# Patient Record
Sex: Female | Born: 1975 | Race: White | Hispanic: No | Smoking: Current every day smoker
Health system: Southern US, Community
[De-identification: ages and names within clinical notes are randomized; demographics above are authoritative.]

## PROBLEM LIST (undated history)

## (undated) DIAGNOSIS — R519 Headache, unspecified: Secondary | ICD-10-CM

## (undated) DIAGNOSIS — I10 Essential (primary) hypertension: Secondary | ICD-10-CM

## (undated) DIAGNOSIS — F419 Anxiety disorder, unspecified: Secondary | ICD-10-CM

## (undated) DIAGNOSIS — R87629 Unspecified abnormal cytological findings in specimens from vagina: Secondary | ICD-10-CM

## (undated) DIAGNOSIS — R51 Headache: Secondary | ICD-10-CM

## (undated) HISTORY — DX: Unspecified abnormal cytological findings in specimens from vagina: R87.629

## (undated) HISTORY — DX: Headache, unspecified: R51.9

## (undated) HISTORY — DX: Essential (primary) hypertension: I10

## (undated) HISTORY — DX: Anxiety disorder, unspecified: F41.9

## (undated) HISTORY — DX: Headache: R51

---

## 2001-04-08 ENCOUNTER — Other Ambulatory Visit: Admission: RE | Admit: 2001-04-08 | Discharge: 2001-04-08 | Payer: Self-pay | Admitting: Family Medicine

## 2001-05-27 ENCOUNTER — Other Ambulatory Visit: Admission: RE | Admit: 2001-05-27 | Discharge: 2001-05-27 | Payer: Self-pay | Admitting: Family Medicine

## 2002-05-07 ENCOUNTER — Other Ambulatory Visit: Admission: RE | Admit: 2002-05-07 | Discharge: 2002-05-07 | Payer: Self-pay | Admitting: Family Medicine

## 2003-06-25 ENCOUNTER — Other Ambulatory Visit: Admission: RE | Admit: 2003-06-25 | Discharge: 2003-06-25 | Payer: Self-pay | Admitting: Family Medicine

## 2004-08-10 ENCOUNTER — Other Ambulatory Visit: Admission: RE | Admit: 2004-08-10 | Discharge: 2004-08-10 | Payer: Self-pay | Admitting: Family Medicine

## 2005-10-12 ENCOUNTER — Other Ambulatory Visit: Admission: RE | Admit: 2005-10-12 | Discharge: 2005-10-12 | Payer: Self-pay | Admitting: Family Medicine

## 2006-10-25 ENCOUNTER — Other Ambulatory Visit: Admission: RE | Admit: 2006-10-25 | Discharge: 2006-10-25 | Payer: Self-pay | Admitting: Family Medicine

## 2008-07-01 ENCOUNTER — Emergency Department (HOSPITAL_COMMUNITY): Admission: EM | Admit: 2008-07-01 | Discharge: 2008-07-01 | Payer: Self-pay | Admitting: Emergency Medicine

## 2008-07-06 ENCOUNTER — Other Ambulatory Visit: Admission: RE | Admit: 2008-07-06 | Discharge: 2008-07-06 | Payer: Self-pay | Admitting: Obstetrics and Gynecology

## 2008-09-29 ENCOUNTER — Ambulatory Visit (HOSPITAL_COMMUNITY): Admission: RE | Admit: 2008-09-29 | Discharge: 2008-09-29 | Payer: Self-pay | Admitting: Obstetrics & Gynecology

## 2008-10-07 ENCOUNTER — Ambulatory Visit (HOSPITAL_COMMUNITY): Admission: RE | Admit: 2008-10-07 | Discharge: 2008-10-07 | Payer: Self-pay | Admitting: Obstetrics & Gynecology

## 2008-10-18 ENCOUNTER — Ambulatory Visit (HOSPITAL_COMMUNITY): Admission: RE | Admit: 2008-10-18 | Discharge: 2008-10-18 | Payer: Self-pay | Admitting: Obstetrics & Gynecology

## 2008-11-05 ENCOUNTER — Emergency Department (HOSPITAL_COMMUNITY): Admission: EM | Admit: 2008-11-05 | Discharge: 2008-11-06 | Payer: Self-pay | Admitting: Emergency Medicine

## 2008-11-11 ENCOUNTER — Ambulatory Visit (HOSPITAL_COMMUNITY): Admission: RE | Admit: 2008-11-11 | Discharge: 2008-11-11 | Payer: Self-pay | Admitting: Obstetrics & Gynecology

## 2008-12-10 ENCOUNTER — Ambulatory Visit (HOSPITAL_COMMUNITY): Admission: RE | Admit: 2008-12-10 | Discharge: 2008-12-10 | Payer: Self-pay | Admitting: Obstetrics & Gynecology

## 2009-01-20 ENCOUNTER — Inpatient Hospital Stay (HOSPITAL_COMMUNITY): Admission: AD | Admit: 2009-01-20 | Discharge: 2009-01-23 | Payer: Self-pay | Admitting: Obstetrics & Gynecology

## 2009-01-20 ENCOUNTER — Ambulatory Visit: Payer: Self-pay | Admitting: Obstetrics & Gynecology

## 2009-01-21 DIAGNOSIS — O42919 Preterm premature rupture of membranes, unspecified as to length of time between rupture and onset of labor, unspecified trimester: Secondary | ICD-10-CM

## 2009-11-18 IMAGING — US US OB FOLLOW-UP
1 series · 14 of 28 positions shown · non-contrast
Comparison: none

OBSTETRICAL ULTRASOUND:
 This ultrasound was performed in The [HOSPITAL], and the [facility] report will be stored to [REDACTED] PACS.

[Series 1: us ob follow-up · 14 of 31 slices shown]
[im 2/31]
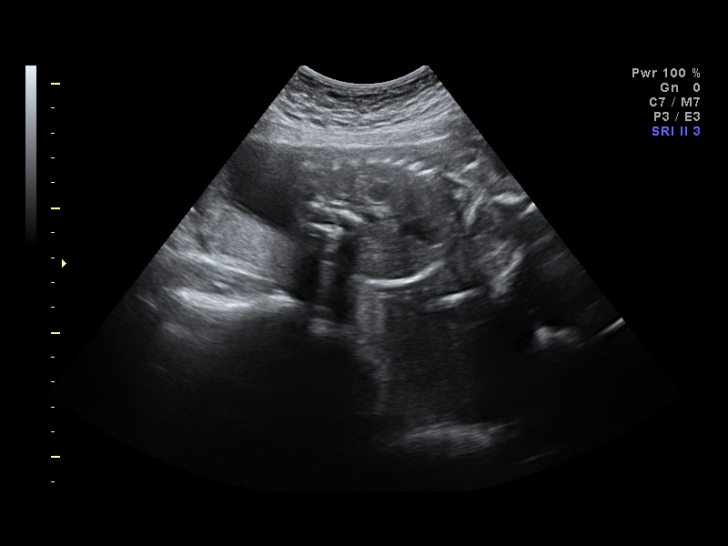
[im 4/31]
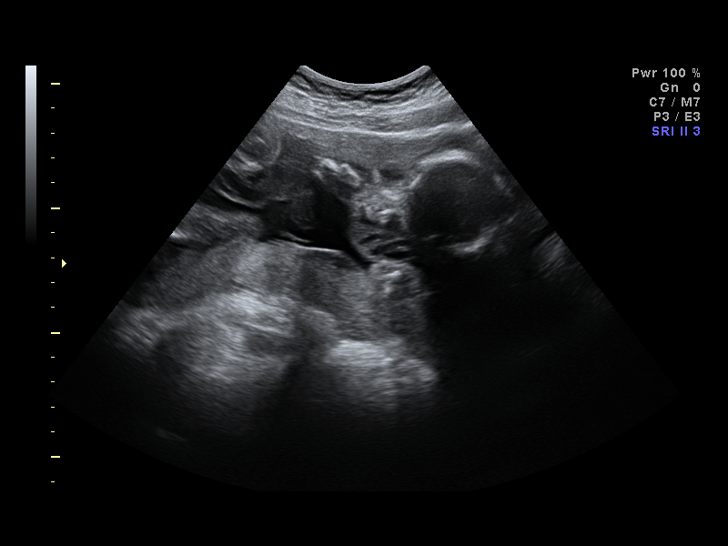
[im 6/31]
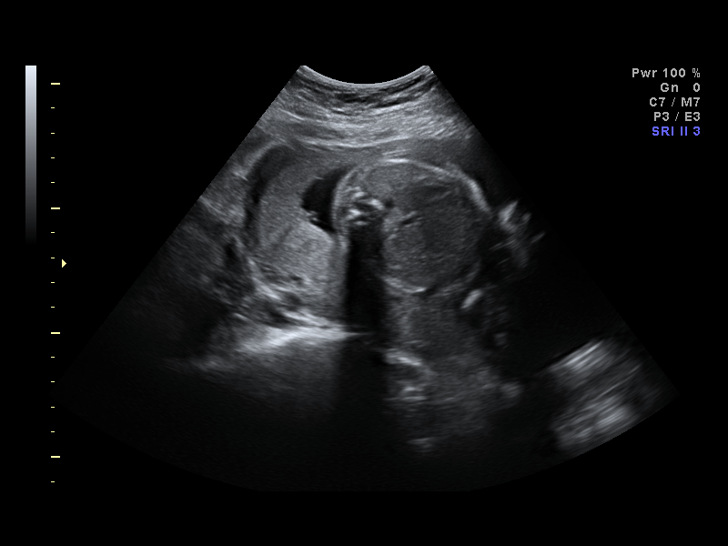
[im 8/31]
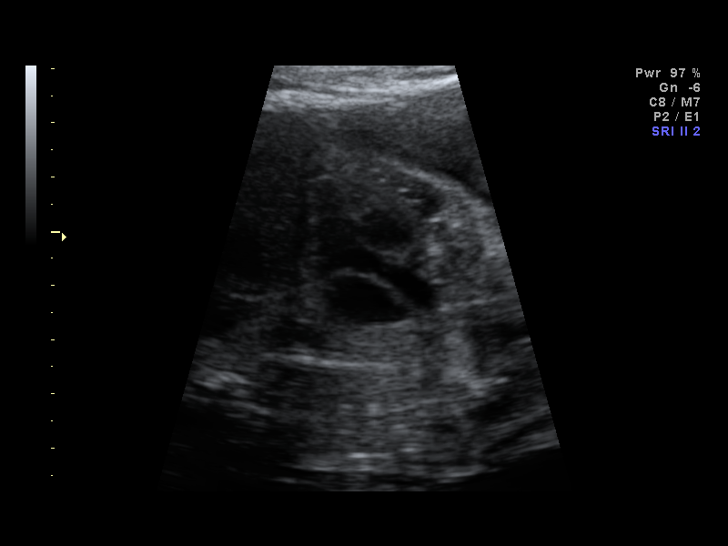
[im 11/31]
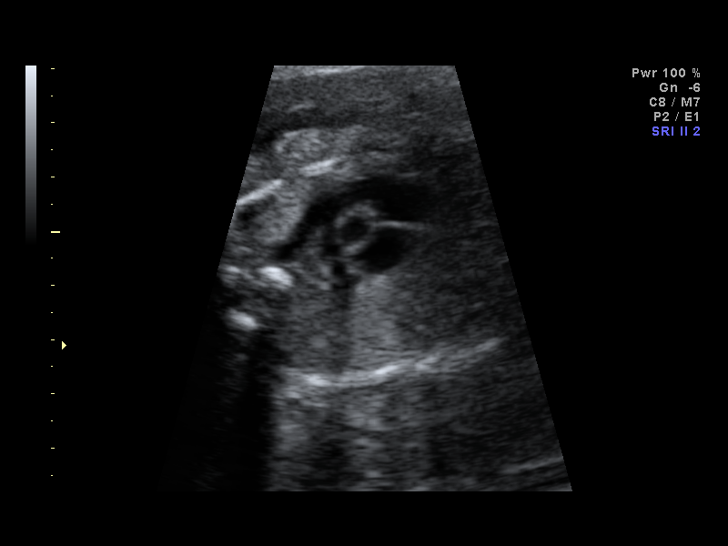
[im 13/31]
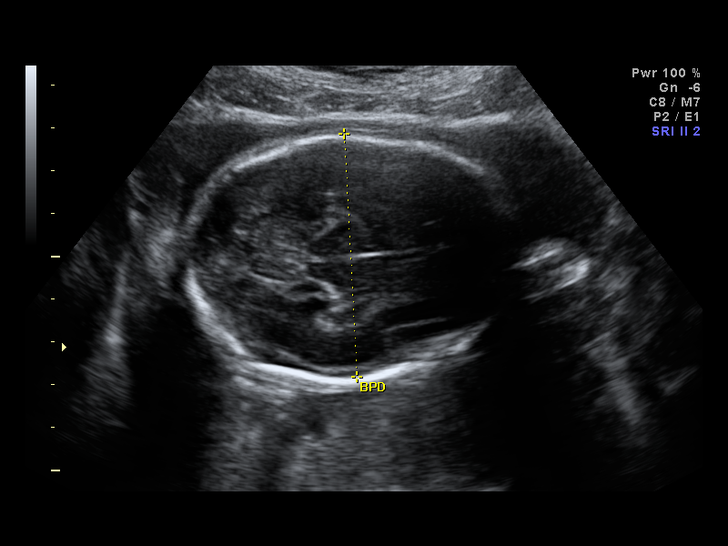
[im 15/31]
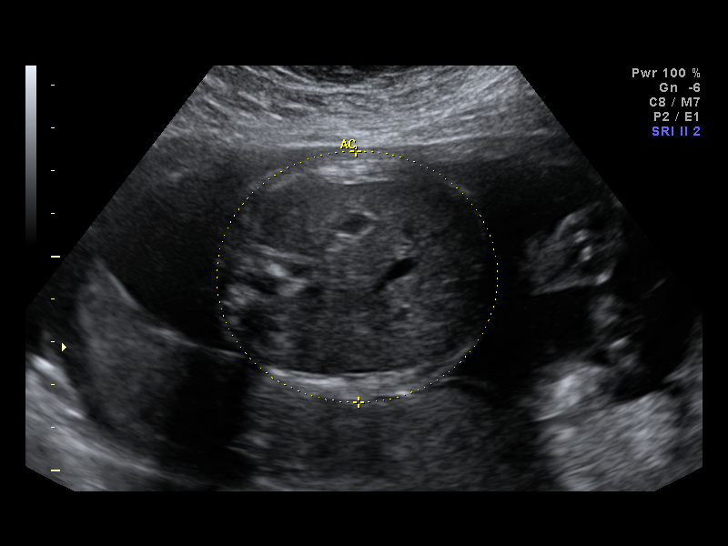
[im 17/31]
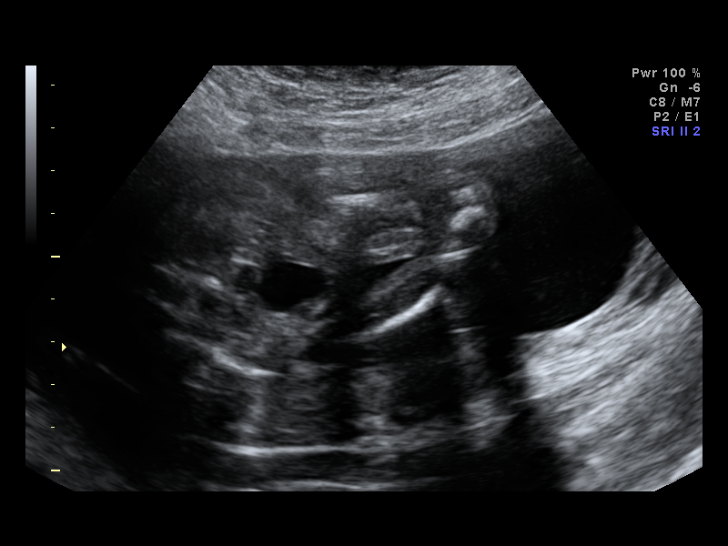
[im 19/31]
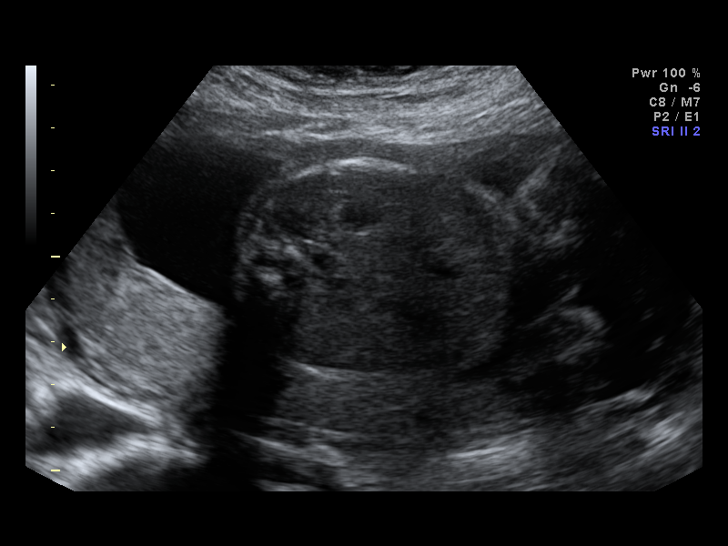
[im 22/31]
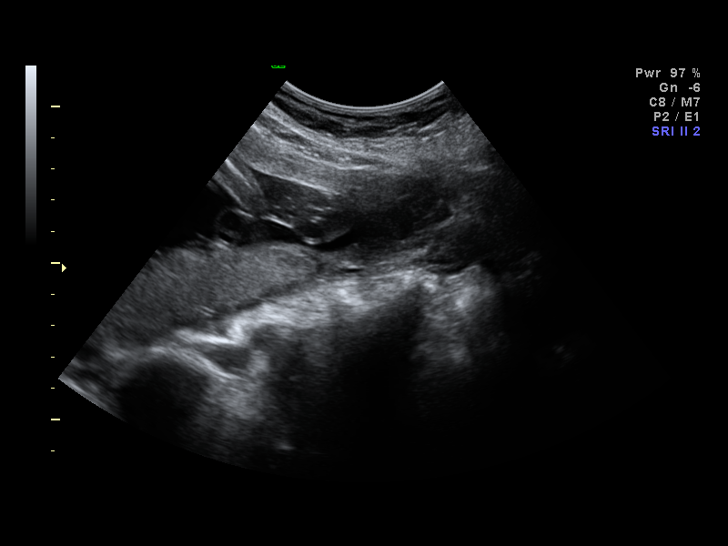
[im 24/31]
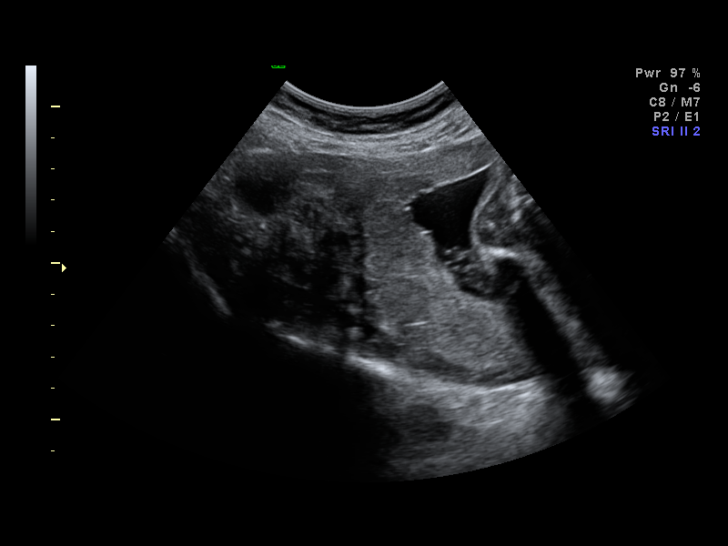
[im 26/31]
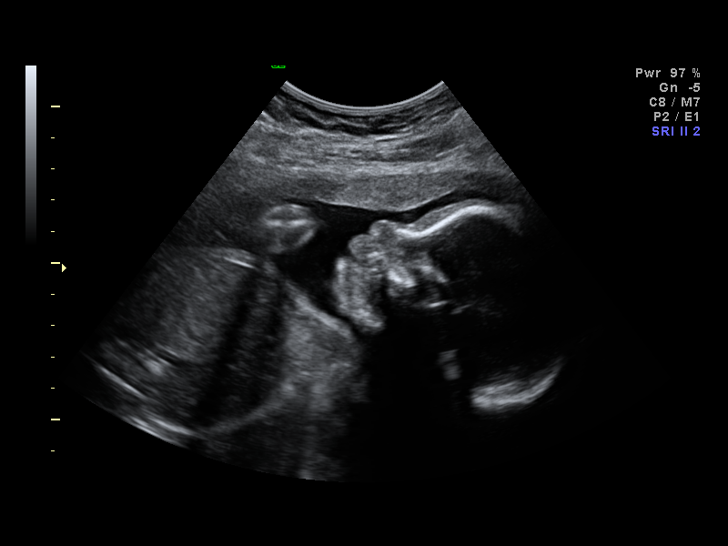
[im 28/31]
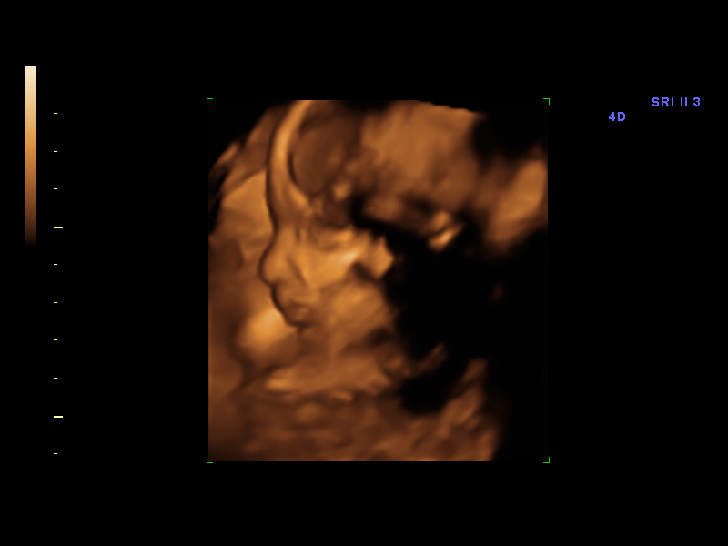
[im 31/31]
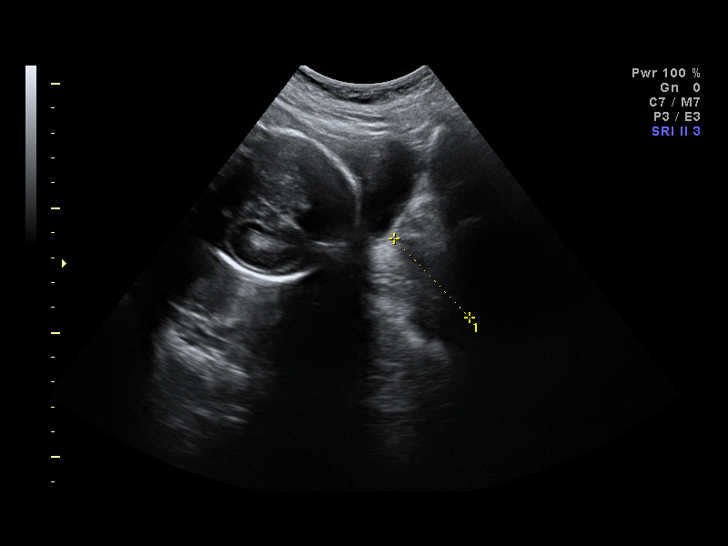

[14 of 28 positions shown; findings below may reference images not displayed]

IMPRESSION: [facility] has also been faxed to the ordering physician.

## 2009-12-17 IMAGING — US US OB FOLLOW-UP
1 series · 18 of 28 positions shown · non-contrast
Comparison: none

OBSTETRICAL ULTRASOUND:
 This ultrasound was performed in The [HOSPITAL], and the [facility] report will be stored to [REDACTED] PACS.

[Series 1: us ob follow-up · 18 of 31 slices shown]
[im 1/31]
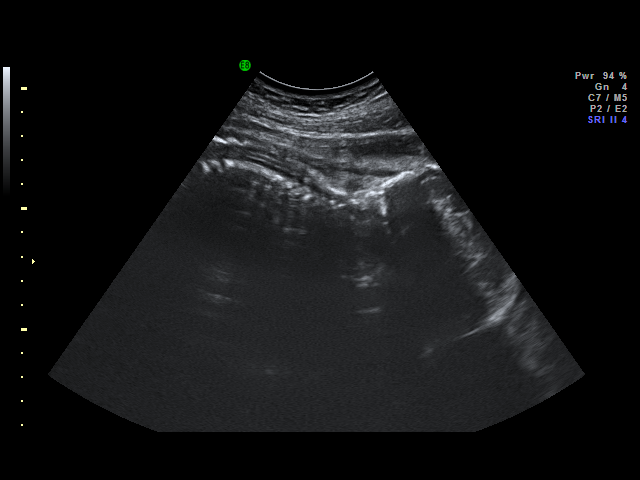
[im 3/31]
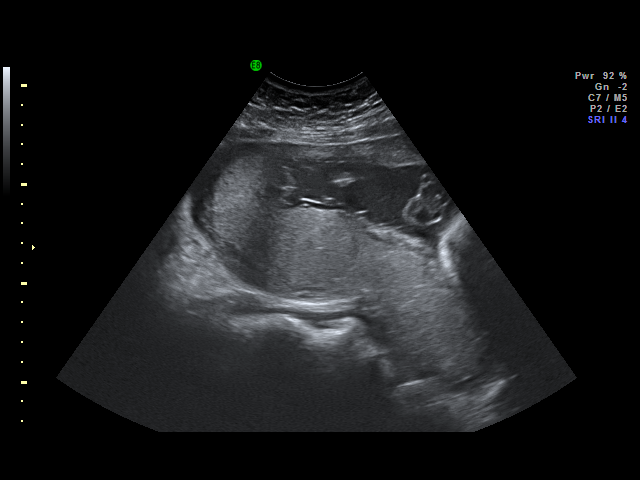
[im 4/31]
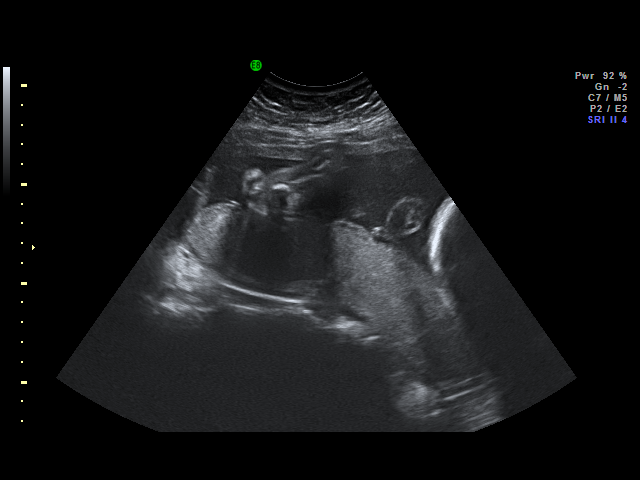
[im 6/31]
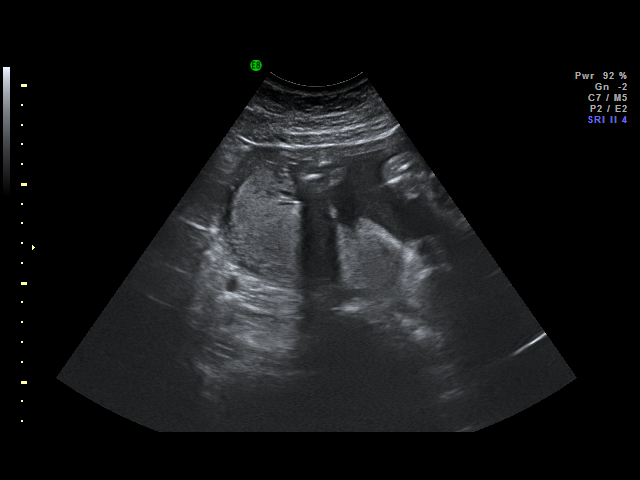
[im 8/31]
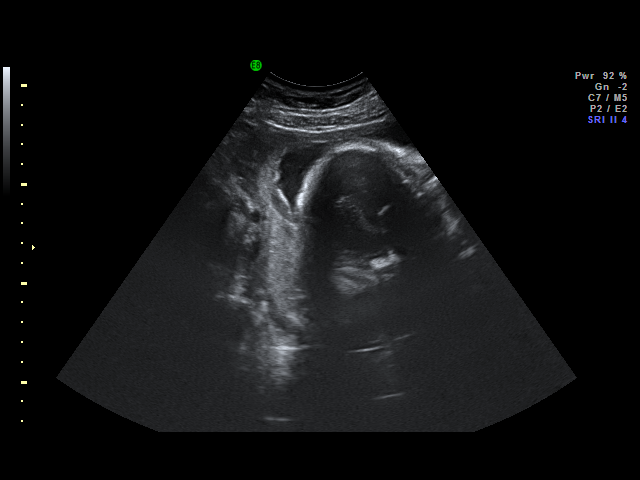
[im 9/31]
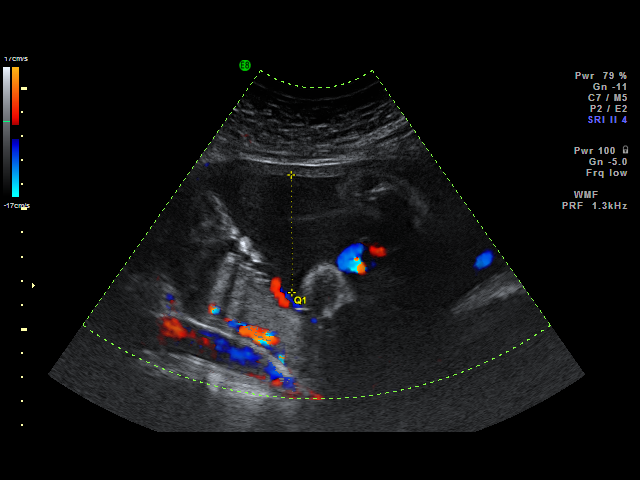
[im 12/31]
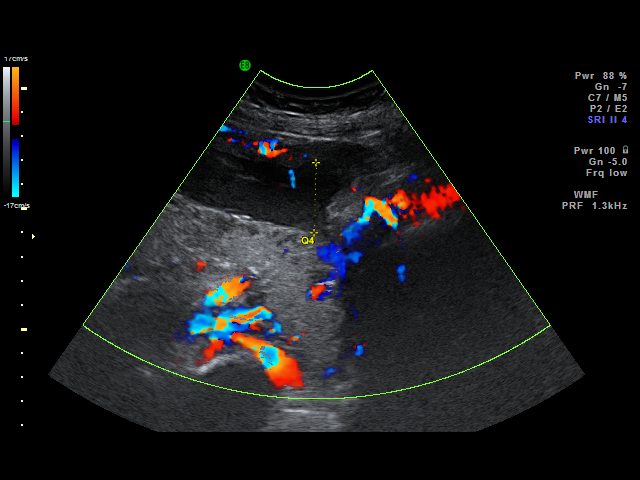
[im 13/31]
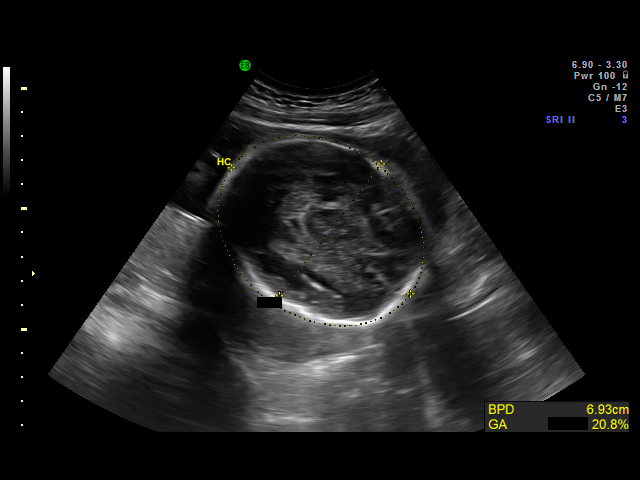
[im 15/31]
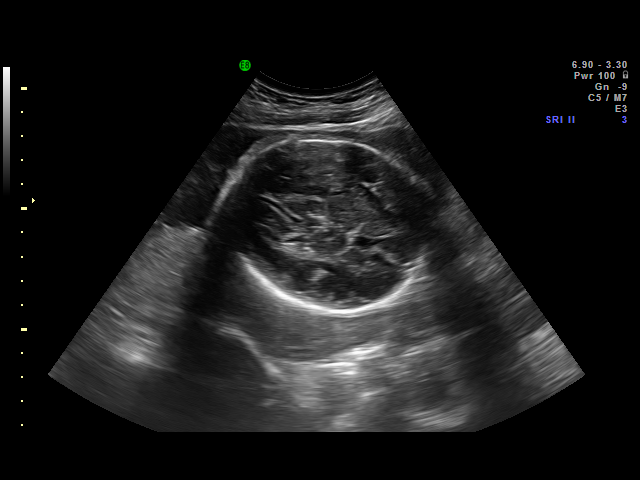
[im 16/31]
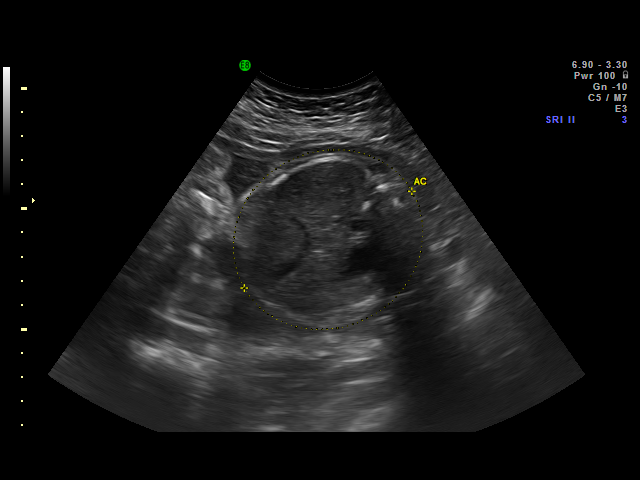
[im 18/31]
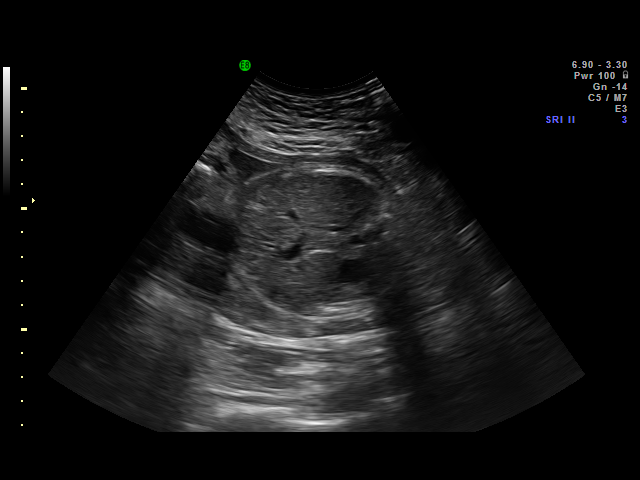
[im 19/31]
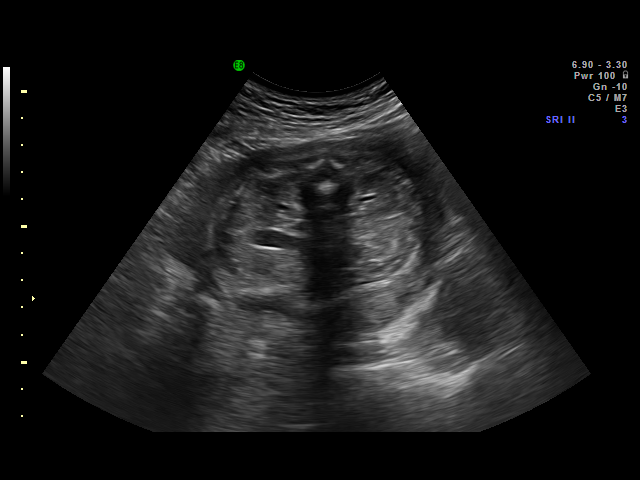
[im 22/31]
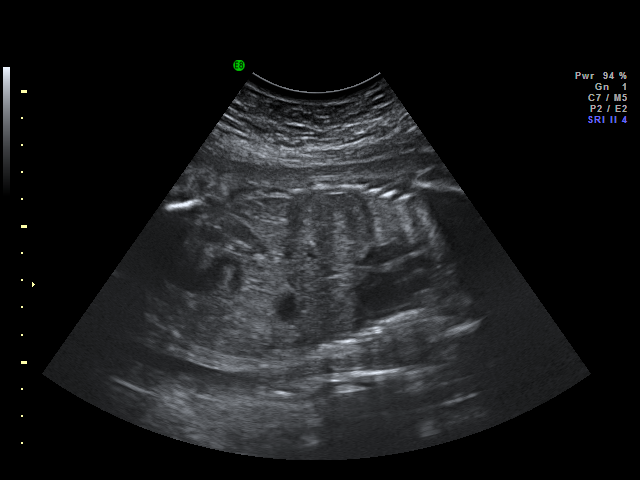
[im 24/31]
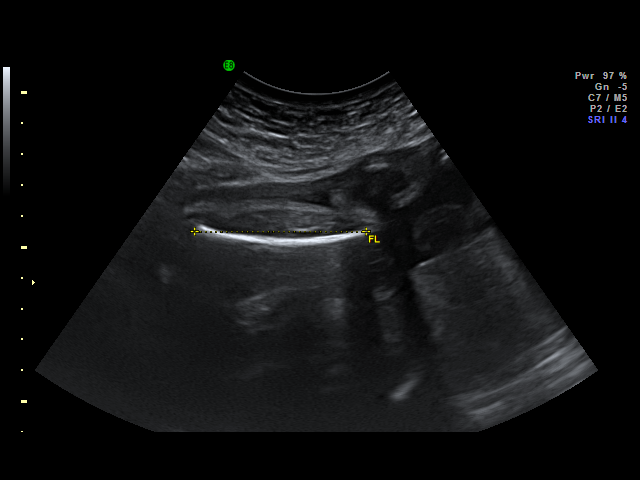
[im 25/31]
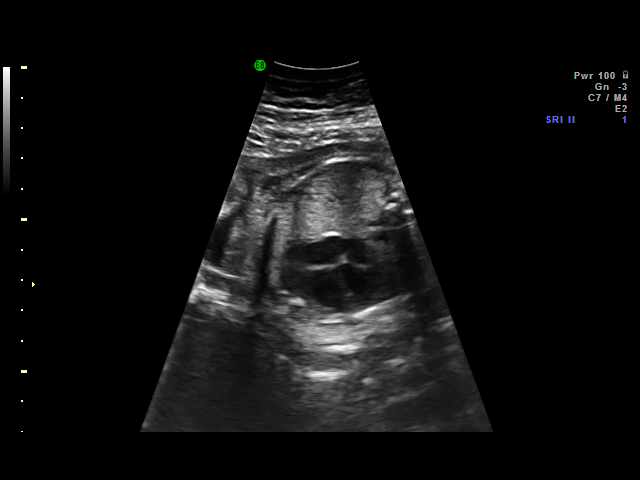
[im 27/31]
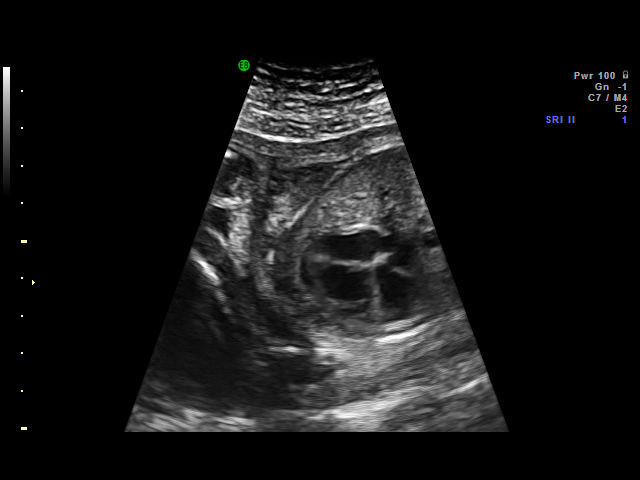
[im 28/31]
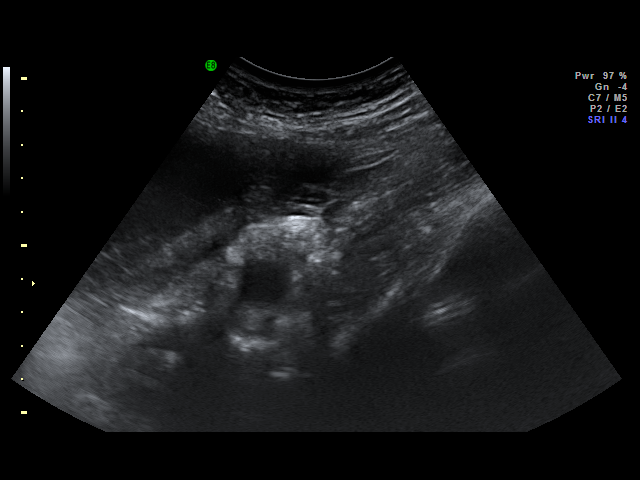
[im 31/31]
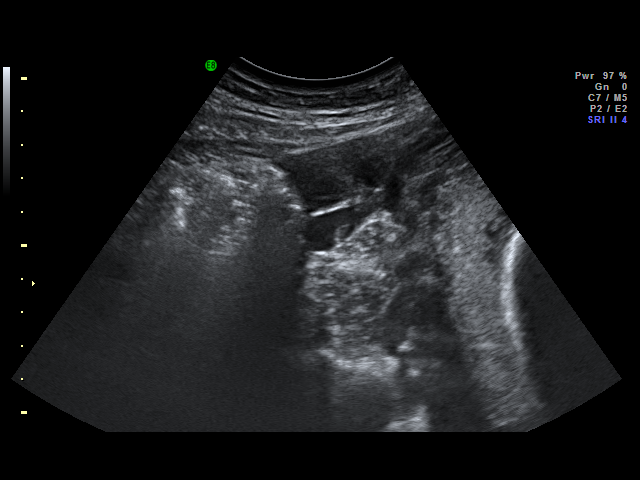

[18 of 28 positions shown; findings below may reference images not displayed]

IMPRESSION: [facility] has also been faxed to the ordering physician.

## 2010-10-22 LAB — RH IMMUNE GLOB WKUP(>/=20WKS)(NOT WOMEN'S HOSP): Fetal Screen: NEGATIVE

## 2010-10-22 LAB — CBC
HCT: 32.9 % — ABNORMAL LOW (ref 36.0–46.0)
Hemoglobin: 10 g/dL — ABNORMAL LOW (ref 12.0–15.0)
Platelets: 248 10*3/uL (ref 150–400)
RBC: 2.97 MIL/uL — ABNORMAL LOW (ref 3.87–5.11)
RDW: 13.2 % (ref 11.5–15.5)
WBC: 12.5 10*3/uL — ABNORMAL HIGH (ref 4.0–10.5)
WBC: 14.1 10*3/uL — ABNORMAL HIGH (ref 4.0–10.5)

## 2010-10-22 LAB — RPR: RPR Ser Ql: NONREACTIVE

## 2014-11-04 LAB — PSA: PSA: NEGATIVE

## 2014-11-24 ENCOUNTER — Encounter: Payer: Self-pay | Admitting: Obstetrics and Gynecology

## 2014-11-24 ENCOUNTER — Ambulatory Visit (INDEPENDENT_AMBULATORY_CARE_PROVIDER_SITE_OTHER): Payer: Medicaid Other | Admitting: Obstetrics and Gynecology

## 2014-11-24 VITALS — BP 100/60 | Ht 65.0 in | Wt 148.0 lb

## 2014-11-24 DIAGNOSIS — Z3009 Encounter for other general counseling and advice on contraception: Secondary | ICD-10-CM | POA: Diagnosis not present

## 2014-11-24 NOTE — Progress Notes (Signed)
Patient ID: Beverly Walker, female   DOB: [birth year], 39 y.o.   MRN: [id number]   [facility] Visit  Patient name: Beverly Walker MRN [id number]  Date of birth: January 17, 1976  CC & HPI:  Beverly Walker is a 39 y.o. female presenting today for discussion of a tubal ligation that she would like to have done. Patient is currently covered by Medicaid.   She has been on BCP since she was 39 years old; patient is currently on Micronor. She reports they are normal, lasting 3-4 days, and are fairly light.   ROS:  A complete 10 system review of systems was obtained and all systems are negative except as noted in the HPI and PMH.    Pertinent History Reviewed:   Reviewed: Significant for Cesarean section Medical        History reviewed. No pertinent past medical history.                            Surgical Hx:    Past Surgical History  Procedure Laterality Date  . Cesarean section     Medications: Reviewed & Updated - see associated section                       Current outpatient prescriptions:  .  norethindrone (MICRONOR,CAMILA,ERRIN) 0.35 MG tablet, Take 1 tablet by mouth daily., Disp: , Rfl:    Social History: Reviewed -  reports that she has been smoking Cigarettes.  She has a 11 pack-year smoking history. She has never used smokeless tobacco.  Objective Findings:  Vitals: Blood pressure 100/60, height 5' 5"  (1.651 m), weight 148 lb (67.132 kg), last menstrual period 11/08/2014.  Physical Examination: Patient is here for discussion only.   Assessment & Plan:   A:  1. Patient desires permanent contraception.  P:  1. Discussed for about 10 minutes patient's various options for contraception, including salpingectomy and endometrial ablation.    This chart was SCRIBED for Mallory Shirk, MD by Stephania Fragmin, ED Scribe. This patient was seen in my office, and the patient's care was started at 3:12 PM.  I personally performed the services described in this documentation, which was  SCRIBED in my presence. The recorded information has been reviewed and considered accurate. It has been edited as necessary during review. Jonnie Kind, MD

## 2014-11-24 NOTE — Progress Notes (Signed)
Patient ID: Beverly Walker, female   DOB: 02/25/1976, 39 y.o.   MRN: [id number] Pt here today for consult. Pt states that she wants to discuss a tubal.

## 2014-11-30 ENCOUNTER — Encounter: Payer: Self-pay | Admitting: *Deleted

## 2014-12-23 ENCOUNTER — Encounter: Payer: Medicaid Other | Admitting: Obstetrics and Gynecology

## 2014-12-23 ENCOUNTER — Telehealth: Payer: Self-pay | Admitting: Obstetrics and Gynecology

## 2014-12-23 NOTE — Telephone Encounter (Signed)
Effort made to talk to pt by phone , as appt rescheduled. Patient not available, will have office contact pt.

## 2015-02-20 ENCOUNTER — Encounter (HOSPITAL_COMMUNITY): Payer: Self-pay | Admitting: Emergency Medicine

## 2015-02-20 ENCOUNTER — Emergency Department (HOSPITAL_COMMUNITY)
Admission: EM | Admit: 2015-02-20 | Discharge: 2015-02-20 | Disposition: A | Discharge status: Home / Self Care | Payer: Medicaid Other | Attending: Emergency Medicine | Admitting: Emergency Medicine

## 2015-02-20 DIAGNOSIS — Z793 Long term (current) use of hormonal contraceptives: Secondary | ICD-10-CM | POA: Insufficient documentation

## 2015-02-20 DIAGNOSIS — Y9384 Activity, sleeping: Secondary | ICD-10-CM | POA: Diagnosis not present

## 2015-02-20 DIAGNOSIS — Z720 Tobacco use: Secondary | ICD-10-CM | POA: Insufficient documentation

## 2015-02-20 DIAGNOSIS — T161XXA Foreign body in right ear, initial encounter: Secondary | ICD-10-CM | POA: Diagnosis not present

## 2015-02-20 DIAGNOSIS — X58XXXA Exposure to other specified factors, initial encounter: Secondary | ICD-10-CM | POA: Diagnosis not present

## 2015-02-20 DIAGNOSIS — Z8659 Personal history of other mental and behavioral disorders: Secondary | ICD-10-CM | POA: Insufficient documentation

## 2015-02-20 DIAGNOSIS — T169XXA Foreign body in ear, unspecified ear, initial encounter: Secondary | ICD-10-CM | POA: Diagnosis present

## 2015-02-20 DIAGNOSIS — Y998 Other external cause status: Secondary | ICD-10-CM | POA: Insufficient documentation

## 2015-02-20 DIAGNOSIS — Y9289 Other specified places as the place of occurrence of the external cause: Secondary | ICD-10-CM | POA: Insufficient documentation

## 2015-02-20 NOTE — ED Notes (Signed)
Patient states she woke up and felt something crawling in ear. Bug noted to outside of ear canal.

## 2015-02-20 NOTE — Discharge Instructions (Signed)
Ear Foreign Body An ear foreign body is an object that is stuck in the ear. It is common for young children to put objects into the ear canal. These may include pebbles, beads, beans, and any other small objects which will fit. In adults, objects such as cotton swabs may become lodged in the ear canal. In all ages, the most common foreign bodies are insects that enter the ear canal.  SYMPTOMS  Foreign bodies may cause pain, buzzing or roaring sounds, hearing loss, and ear drainage.  HOME CARE INSTRUCTIONS   Keep all follow-up appointments with your caregiver as told.  Keep small objects out of reach of young children. Tell them not to put anything in their ears. SEEK IMMEDIATE MEDICAL CARE IF:   You have bleeding from the ear.  You have increased pain or swelling of the ear.  You have reduced hearing.  You have discharge coming from the ear.  You have a fever.  You have a headache. MAKE SURE YOU:   Understand these instructions.  Will watch your condition.  Will get help right away if you are not doing well or get worse. Document Released: 06/29/2000 Document Revised: 09/24/2011 Document Reviewed: 02/18/2008 [facility] Patient Information 2015 [place], [place]. This information is not intended to replace advice given to you by your health care provider. Make sure you discuss any questions you have with your health care provider.

## 2015-02-20 NOTE — ED Provider Notes (Signed)
CSN: [id number]     Arrival date & time 02/20/15  0532 History   First MD Initiated Contact with Patient 02/20/15 [phone]     Chief Complaint  Patient presents with  . Foreign Body in Ear     (Consider location/radiation/quality/duration/timing/severity/associated sxs/prior Treatment) HPI   39yF with FB in ear. Was sleeping when felt like bug crawled in ear. FB sensation and can feel something moving. Not really painful but extremely aggravating. Tried flushing it out with water w/o success. No other complaints.   Past Medical History  Diagnosis Date  . Vaginal Pap smear, abnormal 2005, 2014    abnormal x2 in 2005, ASCUS 2014  . Anxiety   . Headache    Past Surgical History  Procedure Laterality Date  . Cesarean section     Family History  Problem Relation Age of Onset  . Thyroid disease Mother   . Hyperlipidemia Mother   . Arthritis Mother   . Arthritis Maternal Grandmother   . Diabetes Maternal Grandfather   . Kidney disease Maternal Grandfather   . Cancer Paternal Grandmother     breast,stomach,colon  . Diabetes Paternal Grandmother    History  Substance Use Topics  . Smoking status: Current Every Day Smoker -- 0.50 packs/day for 22 years    Types: Cigarettes  . Smokeless tobacco: Never Used  . Alcohol Use: No   OB History    Gravida Para Term Preterm AB TAB SAB Ectopic Multiple Living   5 2   3     2      Review of Systems  All systems reviewed and negative, other than as noted in HPI.   Allergies  Nickel  Home Medications   Prior to Admission medications   Medication Sig Start Date End Date Taking? Authorizing Provider  norethindrone (MICRONOR,CAMILA,ERRIN) 0.35 MG tablet Take 1 tablet by mouth daily.    Historical Provider, MD   BP 109/84 mmHg  Pulse 88  Temp(Src) 98.5 F (36.9 C) (Oral)  Resp 20  Ht 5' 5"  (1.651 m)  Wt 150 lb (68.04 kg)  BMI 24.96 kg/m2  SpO2 98%  LMP 02/18/2015 Physical Exam  Constitutional: She appears well-developed and  well-nourished. No distress.  HENT:  Head: Normocephalic and atraumatic.  R ear with cockroach in ext aud canal  Eyes: Conjunctivae are normal. Right eye exhibits no discharge. Left eye exhibits no discharge.  Neck: Neck supple.  Cardiovascular: Normal rate, regular rhythm and normal heart sounds.  Exam reveals no gallop and no friction rub.   No murmur heard. Pulmonary/Chest: Effort normal and breath sounds normal. No respiratory distress.  Abdominal: Soft. She exhibits no distension. There is no tenderness.  Musculoskeletal: She exhibits no edema or tenderness.  Neurological: She is alert.  Skin: Skin is warm and dry.  Psychiatric: She has a normal mood and affect. Her behavior is normal. Thought content normal.  Nursing note and vitals reviewed.   ED Course  FOREIGN BODY REMOVAL Date/Time: 02/20/2015 5:40 AM Performed by: Virgel Manifold Authorized by: Virgel Manifold Consent: Verbal consent obtained. Consent given by: patient Body area: ear Location details: right ear Patient sedated: no Patient cooperative: yes Localization method: visualized Removal mechanism: forceps Complexity: simple 1 objects recovered. Post-procedure assessment: foreign body removed Patient tolerance: Patient tolerated the procedure well with no immediate complications Comments: Live cockroach removed from R ear   (including critical care time) Labs Review Labs Reviewed - No data to display  Imaging Review No results found.   EKG Interpretation  None      MDM   Final diagnoses:  Foreign body in ear, right, initial encounter    39yF with cockroach in R ear. Removed in entirety w/o complication.     Virgel Manifold, MD 02/20/15 [phone]

## 2016-03-13 ENCOUNTER — Encounter: Payer: Self-pay | Admitting: Adult Health

## 2016-03-13 ENCOUNTER — Ambulatory Visit (INDEPENDENT_AMBULATORY_CARE_PROVIDER_SITE_OTHER): Payer: Medicaid Other | Admitting: Adult Health

## 2016-03-13 VITALS — BP 118/60 | HR 82 | Ht 65.5 in | Wt 161.5 lb

## 2016-03-13 DIAGNOSIS — O09522 Supervision of elderly multigravida, second trimester: Secondary | ICD-10-CM

## 2016-03-13 DIAGNOSIS — Z3201 Encounter for pregnancy test, result positive: Secondary | ICD-10-CM

## 2016-03-13 DIAGNOSIS — Z363 Encounter for antenatal screening for malformations: Secondary | ICD-10-CM

## 2016-03-13 DIAGNOSIS — Z3490 Encounter for supervision of normal pregnancy, unspecified, unspecified trimester: Secondary | ICD-10-CM

## 2016-03-13 LAB — POCT URINE PREGNANCY: Preg Test, Ur: POSITIVE — AB

## 2016-03-13 MED ORDER — PRENATAL PLUS 27-1 MG PO TABS: 1.0000 | ORAL_TABLET | Freq: Every day | ORAL | 12 refills | Status: DC

## 2016-03-13 NOTE — Patient Instructions (Signed)

## 2016-03-13 NOTE — Progress Notes (Signed)
Subjective:     Patient ID: Beverly Walker, female   DOB: [birth year], 40 y.o.   MRN: [id number]  HPI Beverly Walker is a 40 year old white in for UPT, she had been on miconor and missed 2 pills, has since stopped micronor.She is feeling flutters.She has 39 yo and 73 yo daughters at home.She had wanted tubes tied but backed out.   Review of Systems Patient denies any headaches, hearing loss, fatigue, blurred vision, shortness of breath, chest pain, abdominal pain, problems with bowel movements, urination, or intercourse. No joint pain or mood swings. Reviewed past medical,surgical, social and family history. Reviewed medications and allergies.     Objective:   Physical Exam BP 118/60 (BP Location: Left Arm, Patient Position: Sitting, Cuff Size: Normal)   Pulse 82   Ht 5' 5.5" (1.664 m)   Wt 161 lb 8 oz (73.3 kg)   LMP 11/09/2015   BMI 26.47 kg/m UPT + about 17+6 weeks by LMP with EDD 08/15/16, Skin warm and dry. Neck: mid line trachea, normal thyroid, good ROM, no lymphadenopathy noted. Lungs: clear to ausculation bilaterally. Cardiovascular: regular rate and rhythm. FH U-1, FHR 145 via doppler, abdomen is soft and non tender. Decrease smoking.She is worried about age, can get AFP if not too far along after [place] confirmation.     Assessment:     Pregnancy examination or test, positive result - Plan: POCT urine pregnancy  Pregnant  AMA (advanced maternal age) multigravida 40+, second trimester  Antenatal screening for malformation using ultrasonics - Plan: US OB Comp + 14 Wk     Plan:     Rx prenatal plus #30 take 1 daily with 11 refills Return in 2 days for dating and anatomy US Review handout on second trimester

## 2016-03-15 ENCOUNTER — Ambulatory Visit (INDEPENDENT_AMBULATORY_CARE_PROVIDER_SITE_OTHER): Payer: Medicaid Other

## 2016-03-15 DIAGNOSIS — Z3A20 20 weeks gestation of pregnancy: Secondary | ICD-10-CM

## 2016-03-15 DIAGNOSIS — Z36 Encounter for antenatal screening of mother: Secondary | ICD-10-CM

## 2016-03-15 DIAGNOSIS — Z363 Encounter for antenatal screening for malformations: Secondary | ICD-10-CM

## 2016-03-15 NOTE — Progress Notes (Signed)
[place] 19+5 wks,efw 320 g,svp of fluid 4.7 cm,fhr 148 bpm,normal ov's bilat,cx 3.1 cm,cephalic,post pl gr 0, limited view of spine and post fossa,please have pt come back for additional images,no obvious abnormalities seen

## 2016-03-28 ENCOUNTER — Ambulatory Visit (INDEPENDENT_AMBULATORY_CARE_PROVIDER_SITE_OTHER): Payer: Medicaid Other | Admitting: Women's Health

## 2016-03-28 ENCOUNTER — Encounter: Payer: Self-pay | Admitting: Women's Health

## 2016-03-28 ENCOUNTER — Other Ambulatory Visit (HOSPITAL_COMMUNITY)
Admission: RE | Admit: 2016-03-28 | Discharge: 2016-03-28 | Disposition: A | Discharge status: Home / Self Care | Payer: Medicaid Other | Source: Ambulatory Visit | Attending: Obstetrics & Gynecology | Admitting: Obstetrics & Gynecology

## 2016-03-28 VITALS — BP 100/68 | HR 84 | Wt 163.0 lb

## 2016-03-28 DIAGNOSIS — F17200 Nicotine dependence, unspecified, uncomplicated: Secondary | ICD-10-CM | POA: Insufficient documentation

## 2016-03-28 DIAGNOSIS — O09522 Supervision of elderly multigravida, second trimester: Secondary | ICD-10-CM

## 2016-03-28 DIAGNOSIS — Z3492 Encounter for supervision of normal pregnancy, unspecified, second trimester: Secondary | ICD-10-CM | POA: Diagnosis not present

## 2016-03-28 DIAGNOSIS — Z01411 Encounter for gynecological examination (general) (routine) with abnormal findings: Secondary | ICD-10-CM | POA: Diagnosis present

## 2016-03-28 DIAGNOSIS — O0930 Supervision of pregnancy with insufficient antenatal care, unspecified trimester: Secondary | ICD-10-CM | POA: Insufficient documentation

## 2016-03-28 DIAGNOSIS — Z124 Encounter for screening for malignant neoplasm of cervix: Secondary | ICD-10-CM

## 2016-03-28 DIAGNOSIS — O09212 Supervision of pregnancy with history of pre-term labor, second trimester: Secondary | ICD-10-CM | POA: Diagnosis not present

## 2016-03-28 DIAGNOSIS — O09292 Supervision of pregnancy with other poor reproductive or obstetric history, second trimester: Secondary | ICD-10-CM

## 2016-03-28 DIAGNOSIS — O09299 Supervision of pregnancy with other poor reproductive or obstetric history, unspecified trimester: Secondary | ICD-10-CM | POA: Insufficient documentation

## 2016-03-28 DIAGNOSIS — Z331 Pregnant state, incidental: Secondary | ICD-10-CM

## 2016-03-28 DIAGNOSIS — Z3490 Encounter for supervision of normal pregnancy, unspecified, unspecified trimester: Secondary | ICD-10-CM | POA: Insufficient documentation

## 2016-03-28 DIAGNOSIS — Z1389 Encounter for screening for other disorder: Secondary | ICD-10-CM | POA: Diagnosis not present

## 2016-03-28 DIAGNOSIS — Z8751 Personal history of pre-term labor: Secondary | ICD-10-CM | POA: Insufficient documentation

## 2016-03-28 DIAGNOSIS — O99322 Drug use complicating pregnancy, second trimester: Secondary | ICD-10-CM | POA: Diagnosis not present

## 2016-03-28 DIAGNOSIS — Z3A22 22 weeks gestation of pregnancy: Secondary | ICD-10-CM | POA: Diagnosis not present

## 2016-03-28 DIAGNOSIS — Z369 Encounter for antenatal screening, unspecified: Secondary | ICD-10-CM

## 2016-03-28 DIAGNOSIS — O34219 Maternal care for unspecified type scar from previous cesarean delivery: Secondary | ICD-10-CM

## 2016-03-28 DIAGNOSIS — F17210 Nicotine dependence, cigarettes, uncomplicated: Secondary | ICD-10-CM | POA: Diagnosis not present

## 2016-03-28 DIAGNOSIS — O0932 Supervision of pregnancy with insufficient antenatal care, second trimester: Secondary | ICD-10-CM | POA: Diagnosis not present

## 2016-03-28 DIAGNOSIS — Z3682 Encounter for antenatal screening for nuchal translucency: Secondary | ICD-10-CM

## 2016-03-28 DIAGNOSIS — Z113 Encounter for screening for infections with a predominantly sexual mode of transmission: Secondary | ICD-10-CM | POA: Insufficient documentation

## 2016-03-28 DIAGNOSIS — O09892 Supervision of other high risk pregnancies, second trimester: Secondary | ICD-10-CM

## 2016-03-28 DIAGNOSIS — Z98891 History of uterine scar from previous surgery: Secondary | ICD-10-CM | POA: Insufficient documentation

## 2016-03-28 DIAGNOSIS — Z0283 Encounter for blood-alcohol and blood-drug test: Secondary | ICD-10-CM

## 2016-03-28 DIAGNOSIS — O99332 Smoking (tobacco) complicating pregnancy, second trimester: Secondary | ICD-10-CM | POA: Diagnosis not present

## 2016-03-28 DIAGNOSIS — O09529 Supervision of elderly multigravida, unspecified trimester: Secondary | ICD-10-CM | POA: Insufficient documentation

## 2016-03-28 DIAGNOSIS — Z1151 Encounter for screening for human papillomavirus (HPV): Secondary | ICD-10-CM | POA: Diagnosis not present

## 2016-03-28 DIAGNOSIS — F1290 Cannabis use, unspecified, uncomplicated: Secondary | ICD-10-CM | POA: Insufficient documentation

## 2016-03-28 LAB — POCT URINALYSIS DIPSTICK
Glucose, UA: NEGATIVE
KETONES UA: NEGATIVE
LEUKOCYTES UA: NEGATIVE
NITRITE UA: NEGATIVE
PROTEIN UA: NEGATIVE

## 2016-03-28 MED ORDER — CITRANATAL ASSURE 35-1 & 300 MG PO MISC: ORAL | 11 refills | Status: DC

## 2016-03-28 MED ORDER — DOXYLAMINE-PYRIDOXINE 10-10 MG PO TBEC: DELAYED_RELEASE_TABLET | ORAL | 6 refills | Status: DC

## 2016-03-28 MED ORDER — HYDROXYPROGESTERONE CAPROATE 250 MG/ML IM OIL
250.0000 mg | TOPICAL_OIL | Freq: Once | INTRAMUSCULAR | Status: AC
  Administered 2016-03-28: 250 mg via INTRAMUSCULAR

## 2016-03-28 NOTE — Patient Instructions (Addendum)
Begin taking a 6m baby aspirin daily to decrease risk of preeclampsia during pregnancy   Second Trimester of Pregnancy The second trimester is from week 13 through week 28, months 4 through 6. The second trimester is often a time when you feel your best. Your body has also adjusted to being pregnant, and you begin to feel better physically. Usually, morning sickness has lessened or quit completely, you may have more energy, and you may have an increase in appetite. The second trimester is also a time when the fetus is growing rapidly. At the end of the sixth month, the fetus is about 9 inches long and weighs about 1 pounds. You will likely begin to feel the baby move (quickening) between 18 and 20 weeks of the pregnancy. BODY CHANGES Your body goes through many changes during pregnancy. The changes vary from woman to woman.   Your weight will continue to increase. You will notice your lower abdomen bulging out.  You may begin to get stretch marks on your hips, abdomen, and breasts.  You may develop headaches that can be relieved by medicines approved by your health care provider.  You may urinate more often because the fetus is pressing on your bladder.  You may develop or continue to have heartburn as a result of your pregnancy.  You may develop constipation because certain hormones are causing the muscles that push waste through your intestines to slow down.  You may develop hemorrhoids or swollen, bulging veins (varicose veins).  You may have back pain because of the weight gain and pregnancy hormones relaxing your joints between the bones in your pelvis and as a result of a shift in weight and the muscles that support your balance.  Your breasts will continue to grow and be tender.  Your gums may bleed and may be sensitive to brushing and flossing.  Dark spots or blotches (chloasma, mask of pregnancy) may develop on your face. This will likely fade after the baby is born.  A dark  line from your belly button to the pubic area (linea nigra) may appear. This will likely fade after the baby is born.  You may have changes in your hair. These can include thickening of your hair, rapid growth, and changes in texture. Some women also have hair loss during or after pregnancy, or hair that feels dry or thin. Your hair will most likely return to normal after your baby is born. WHAT TO EXPECT AT YOUR PRENATAL VISITS During a routine prenatal visit:  You will be weighed to make sure you and the fetus are growing normally.  Your blood pressure will be taken.  Your abdomen will be measured to track your baby's growth.  The fetal heartbeat will be listened to.  Any test results from the previous visit will be discussed. Your health care provider may ask you:  How you are feeling.  If you are feeling the baby move.  If you have had any abnormal symptoms, such as leaking fluid, bleeding, severe headaches, or abdominal cramping.  If you are using any tobacco products, including cigarettes, chewing tobacco, and electronic cigarettes.  If you have any questions. Other tests that may be performed during your second trimester include:  Blood tests that check for:  Low iron levels (anemia).  Gestational diabetes (between 24 and 28 weeks).  Rh antibodies.  Urine tests to check for infections, diabetes, or protein in the urine.  An ultrasound to confirm the proper growth and development of the  baby.  An amniocentesis to check for possible genetic problems.  Fetal screens for spina bifida and Down syndrome.  HIV (human immunodeficiency virus) testing. Routine prenatal testing includes screening for HIV, unless you choose not to have this test. HOME CARE INSTRUCTIONS   Avoid all smoking, herbs, alcohol, and unprescribed drugs. These chemicals affect the formation and growth of the baby.  Do not use any tobacco products, including cigarettes, chewing tobacco, and  electronic cigarettes. If you need help quitting, ask your health care provider. You may receive counseling support and other resources to help you quit.  Follow your health care provider's instructions regarding medicine use. There are medicines that are either safe or unsafe to take during pregnancy.  Exercise only as directed by your health care provider. Experiencing uterine cramps is a good sign to stop exercising.  Continue to eat regular, healthy meals.  Wear a good support bra for breast tenderness.  Do not use hot tubs, steam rooms, or saunas.  Wear your seat belt at all times when driving.  Avoid raw meat, uncooked cheese, cat litter boxes, and soil used by cats. These carry germs that can cause birth defects in the baby.  Take your prenatal vitamins.  Take 1500-2000 mg of calcium daily starting at the 20th week of pregnancy until you deliver your baby.  Try taking a stool softener (if your health care provider approves) if you develop constipation. Eat more high-fiber foods, such as fresh vegetables or fruit and whole grains. Drink plenty of fluids to keep your urine clear or pale yellow.  Take warm sitz baths to soothe any pain or discomfort caused by hemorrhoids. Use hemorrhoid cream if your health care provider approves.  If you develop varicose veins, wear support hose. Elevate your feet for 15 minutes, 3-4 times a day. Limit salt in your diet.  Avoid heavy lifting, wear low heel shoes, and practice good posture.  Rest with your legs elevated if you have leg cramps or low back pain.  Visit your dentist if you have not gone yet during your pregnancy. Use a soft toothbrush to brush your teeth and be gentle when you floss.  A sexual relationship may be continued unless your health care provider directs you otherwise.  Continue to go to all your prenatal visits as directed by your health care provider. SEEK MEDICAL CARE IF:   You have dizziness.  You have mild pelvic  cramps, pelvic pressure, or nagging pain in the abdominal area.  You have persistent nausea, vomiting, or diarrhea.  You have a bad smelling vaginal discharge.  You have pain with urination. SEEK IMMEDIATE MEDICAL CARE IF:   You have a fever.  You are leaking fluid from your vagina.  You have spotting or bleeding from your vagina.  You have severe abdominal cramping or pain.  You have rapid weight gain or loss.  You have shortness of breath with chest pain.  You notice sudden or extreme swelling of your face, hands, ankles, feet, or legs.  You have not felt your baby move in over an hour.  You have severe headaches that do not go away with medicine.  You have vision changes.   This information is not intended to replace advice given to you by your health care provider. Make sure you discuss any questions you have with your health care provider.   Document Released: 06/26/2001 Document Revised: 07/23/2014 Document Reviewed: 09/02/2012 Elsevier Interactive Patient Education [employer].

## 2016-03-28 NOTE — Progress Notes (Signed)
Subjective:  Beverly Walker is a 40 y.o. [phone] Caucasian female at 56w4dby 19wk u/s, being seen today for her first obstetrical visit.  Her obstetrical history is significant for AMA, TAB x 3, C/S @ 26wks d/t severe pre-e, then VBAC @ 33wks d/t PROM.  Smoker: 1.5-2ppd prior to pregnancy, now 1ppd and interested in quitting, but needs help. Reports THC use d/t nausea. Pregnancy history fully reviewed. Unplanned pregnancy, had come few months ago to scheduled BTL, but 'chickened out', was taking micronor and missed 2 pills  Patient reports no complaints. Denies vb, cramping, uti s/s, abnormal/malodorous vag d/c, or vulvovaginal itching/irritation.  BP 100/68   Pulse 84   Wt 163 lb (73.9 kg)   LMP 11/09/2015   BMI 26.71 kg/m   HISTORY: OB History  Gravida Para Term Preterm AB Living  6 2   2 3 2   SAB TAB Ectopic Multiple Live Births          2    # Outcome Date GA Lbr Len/2nd Weight Sex Delivery Anes PTL Lv  6 Current           5 Preterm 01/21/09 361w6d5 lb (2.268 kg) F VBAC EPI N LIV     Complications: Preterm premature rupture of membranes (PPROM) delivered, current hospitalization  4 Preterm 06/29/98 2666w0d lb 15 oz (0.879 kg) F CS-LTranv Spinal N LIV     Complications: Severe pre-eclampsia  3 AB      TAB     2 AB      TAB     1 AB      TAB        Past Medical History:  Diagnosis Date  . Anxiety   . Headache   . Hypertension    pre-eclampsia  . Vaginal Pap smear, abnormal 2005, 2014   abnormal x2 in 2005, ASCUS 2014   Past Surgical History:  Procedure Laterality Date  . CESAREAN SECTION     Family History  Problem Relation Age of Onset  . Thyroid disease Mother   . Hyperlipidemia Mother   . Arthritis Mother   . Arthritis Father   . COPD Sister   . Arthritis Maternal Grandmother   . Diabetes Maternal Grandfather   . Kidney disease Maternal Grandfather   . Cancer Paternal Grandmother     breast,stomach,colon  . Diabetes Paternal Grandmother     Exam    System:     General: Well developed & nourished, no acute distress   Skin: Warm & dry, normal coloration and turgor, no rashes   Neurologic: Alert & oriented, normal mood   Cardiovascular: Regular rate & rhythm   Respiratory: Effort & rate normal, LCTAB, acyanotic   Abdomen: Soft, non tender   Extremities: normal strength, tone   Pelvic Exam:    Perineum: Normal perineum   Vulva: Normal, no lesions   Vagina:  Normal mucosa, normal discharge   Cervix: Normal, bulbous, appears closed   Uterus: Normal size/shape/contour for GA   Thin prep pap smear obtained w/ high risk HPV cotesting FHR: 150 via doppler   Assessment:   Pregnancy: [id number] are no active problems to display for this patient.   [id number] OB visit AMA H/O c/s @ 26wks d/t severe pre-e H/O VBAC @ 33wks d/t PROM Smoker THC use  Plan:  Initial labs drawn Continue prenatal vitamins Problem list reviewed and updated Reviewed n/v relief measures and warning s/s to report Rx diclegis, prior  auth approved through [employer] today Reviewed recommended weight gain based on pre-gravid BMI Encouraged well-balanced diet Genetic Screening discussed Quad Screen: requested Cystic fibrosis screening discussed declined Ultrasound discussed; fetal survey: reviewed, needs repeat at 28wks d/t limited views  Offered 17P d/t h/o spontaneous ptb @ 33wks, accepts, ordered today, 1st injection today Follow up in 3 weeks for LROB, weekly 17P CCNC completed Smokes 1ppd/day, advised cessation, discussed risks to fetus while pregnant, to infant pp, and to herself. Offered QuitlineNC, accepted, referral sent.    Advised complete cessation of THC Wants another VBAC, gave consent to take home and review Begin baby ASA d/t h/o severe pre-e @ [address] CNM, [facility] 03/28/2016 11:49 AM

## 2016-03-30 LAB — CYTOLOGY - PAP

## 2016-03-30 LAB — URINE CULTURE

## 2016-03-31 ENCOUNTER — Encounter: Payer: Self-pay | Admitting: Women's Health

## 2016-03-31 DIAGNOSIS — O26899 Other specified pregnancy related conditions, unspecified trimester: Secondary | ICD-10-CM

## 2016-03-31 DIAGNOSIS — Z6791 Unspecified blood type, Rh negative: Secondary | ICD-10-CM | POA: Insufficient documentation

## 2016-03-31 DIAGNOSIS — R87619 Unspecified abnormal cytological findings in specimens from cervix uteri: Secondary | ICD-10-CM | POA: Insufficient documentation

## 2016-04-03 ENCOUNTER — Telehealth: Payer: Self-pay | Admitting: Women's Health

## 2016-04-03 DIAGNOSIS — O285 Abnormal chromosomal and genetic finding on antenatal screening of mother: Secondary | ICD-10-CM

## 2016-04-03 DIAGNOSIS — O09522 Supervision of elderly multigravida, second trimester: Secondary | ICD-10-CM

## 2016-04-03 LAB — AFP, QUAD SCREEN
DIA Mom Value: 2.7
DIA Value (EIA): 558.79 pg/mL
DSR (BY AGE) 1 IN: 95
DSR (SECOND TRIMESTER) 1 IN: 107
Gestational Age: 21.4 WEEKS
MATERNAL AGE AT EDD: 39.7 a
MSAFP MOM: 1.88
MSAFP: 118.7 ng/mL
MSHCG Mom: 1.27
MSHCG: 27441 m[IU]/mL
OSB RISK: 1058
T18 (By Age): 1:370 {titer}
TEST RESULTS AFP: POSITIVE — AB
UE3 VALUE: 1.56 ng/mL
WEIGHT: 163 [lb_av]
uE3 Mom: 0.71

## 2016-04-03 LAB — URINALYSIS, ROUTINE W REFLEX MICROSCOPIC
BILIRUBIN UA: NEGATIVE
GLUCOSE, UA: NEGATIVE
KETONES UA: NEGATIVE
Leukocytes, UA: NEGATIVE
NITRITE UA: NEGATIVE
Specific Gravity, UA: 1.018 (ref 1.005–1.030)
UUROB: 0.2 mg/dL (ref 0.2–1.0)
pH, UA: 7 (ref 5.0–7.5)

## 2016-04-03 LAB — PMP SCREEN PROFILE (10S), URINE
Amphetamine Screen, Ur: NEGATIVE ng/mL
BARBITURATE SCRN UR: NEGATIVE ng/mL
BENZODIAZEPINE SCREEN, URINE: NEGATIVE ng/mL
CANNABINOIDS UR QL SCN: POSITIVE ng/mL
Cocaine(Metab.)Screen, Urine: NEGATIVE ng/mL
Creatinine(Crt), U: 115.5 mg/dL (ref 20.0–300.0)
Methadone Scn, Ur: NEGATIVE ng/mL
OXYCODONE+OXYMORPHONE UR QL SCN: NEGATIVE ng/mL
Opiate Scrn, Ur: NEGATIVE ng/mL
PCP Scrn, Ur: NEGATIVE ng/mL
PH UR, DRUG SCRN: 6.9 (ref 4.5–8.9)
Propoxyphene, Screen: NEGATIVE ng/mL

## 2016-04-03 LAB — CBC
HEMATOCRIT: 37.9 % (ref 34.0–46.6)
Hemoglobin: 12.7 g/dL (ref 11.1–15.9)
MCH: 31.2 pg (ref 26.6–33.0)
MCHC: 33.5 g/dL (ref 31.5–35.7)
MCV: 93 fL (ref 79–97)
Platelets: 327 10*3/uL (ref 150–379)
RBC: 4.07 x10E6/uL (ref 3.77–5.28)
RDW: 13.4 % (ref 12.3–15.4)
WBC: 14.8 10*3/uL — ABNORMAL HIGH (ref 3.4–10.8)

## 2016-04-03 LAB — MICROSCOPIC EXAMINATION: CASTS: NONE SEEN /LPF

## 2016-04-03 LAB — RUBELLA SCREEN: RUBELLA: 1.48 {index} (ref >0.99)

## 2016-04-03 LAB — ABO/RH: RH TYPE: NEGATIVE

## 2016-04-03 LAB — HEPATITIS B SURFACE ANTIGEN: HEP B S AG: NEGATIVE

## 2016-04-03 LAB — VARICELLA ZOSTER ANTIBODY, IGG: VARICELLA: 1330 {index} (ref >165)

## 2016-04-03 LAB — RPR: RPR Ser Ql: NONREACTIVE

## 2016-04-03 LAB — ANTIBODY SCREEN: ANTIBODY SCREEN: NEGATIVE

## 2016-04-03 LAB — HIV ANTIBODY (ROUTINE TESTING W REFLEX): HIV SCREEN 4TH GENERATION: NONREACTIVE

## 2016-04-03 NOTE — Telephone Encounter (Signed)
Notified pt of abnormal pap, need for colpo- scheduled for 10/4 @ 1115 w/ JVF. Also notified of AFP 1:107 DSR, offered cfDNA which she wants. Understands it is still not diagnostic. She had called and cancelled her 17P appt for this week b/c she read some stuff online that scared her, specifically relating to blood clots. Discussed she is at an increased r/f blood clots during pregnancy. She has never had any h/o DVT/PE. 17P still recommended, but it is her choice whether she wants to receive 17P or not. States she feels better after discussing it and would like to resume shots. Switched to front desk to reschedule.  Roma Schanz, CNM, [facility] 04/03/2016 11:48 AM

## 2016-04-04 ENCOUNTER — Ambulatory Visit: Payer: Medicaid Other

## 2016-04-06 ENCOUNTER — Encounter: Payer: Self-pay | Admitting: *Deleted

## 2016-04-06 ENCOUNTER — Ambulatory Visit (INDEPENDENT_AMBULATORY_CARE_PROVIDER_SITE_OTHER): Payer: Medicaid Other | Admitting: *Deleted

## 2016-04-06 VITALS — BP 120/72 | HR 80 | Ht 65.0 in | Wt 162.0 lb

## 2016-04-06 DIAGNOSIS — O09212 Supervision of pregnancy with history of pre-term labor, second trimester: Secondary | ICD-10-CM

## 2016-04-06 DIAGNOSIS — Z8751 Personal history of pre-term labor: Secondary | ICD-10-CM

## 2016-04-06 DIAGNOSIS — Z1389 Encounter for screening for other disorder: Secondary | ICD-10-CM | POA: Diagnosis not present

## 2016-04-06 DIAGNOSIS — Z331 Pregnant state, incidental: Secondary | ICD-10-CM

## 2016-04-06 LAB — POCT URINALYSIS DIPSTICK
Glucose, UA: NEGATIVE
Ketones, UA: NEGATIVE
LEUKOCYTES UA: NEGATIVE
NITRITE UA: NEGATIVE
RBC UA: 2

## 2016-04-06 MED ORDER — HYDROXYPROGESTERONE CAPROATE 250 MG/ML IM OIL
250.0000 mg | TOPICAL_OIL | Freq: Once | INTRAMUSCULAR | Status: AC
  Administered 2016-04-06: 250 mg via INTRAMUSCULAR

## 2016-04-06 NOTE — Progress Notes (Signed)
Hydroxyprogesterone caproate 250 mg IM given left ventrogluteal with no complications, all vital signs WNL. No complaints at this time.  Pt states she has her next appt.

## 2016-04-18 ENCOUNTER — Ambulatory Visit (INDEPENDENT_AMBULATORY_CARE_PROVIDER_SITE_OTHER): Payer: Medicaid Other | Admitting: Obstetrics and Gynecology

## 2016-04-18 VITALS — BP 92/60 | HR 86 | Wt 164.8 lb

## 2016-04-18 DIAGNOSIS — Z1389 Encounter for screening for other disorder: Secondary | ICD-10-CM

## 2016-04-18 DIAGNOSIS — Z3A25 25 weeks gestation of pregnancy: Secondary | ICD-10-CM | POA: Diagnosis not present

## 2016-04-18 DIAGNOSIS — O09522 Supervision of elderly multigravida, second trimester: Secondary | ICD-10-CM

## 2016-04-18 DIAGNOSIS — R87612 Low grade squamous intraepithelial lesion on cytologic smear of cervix (LGSIL): Secondary | ICD-10-CM

## 2016-04-18 DIAGNOSIS — Z8751 Personal history of pre-term labor: Secondary | ICD-10-CM

## 2016-04-18 DIAGNOSIS — O09212 Supervision of pregnancy with history of pre-term labor, second trimester: Secondary | ICD-10-CM | POA: Diagnosis not present

## 2016-04-18 DIAGNOSIS — O3442 Maternal care for other abnormalities of cervix, second trimester: Secondary | ICD-10-CM

## 2016-04-18 DIAGNOSIS — Z331 Pregnant state, incidental: Secondary | ICD-10-CM

## 2016-04-18 DIAGNOSIS — N870 Mild cervical dysplasia: Secondary | ICD-10-CM

## 2016-04-18 DIAGNOSIS — R87619 Unspecified abnormal cytological findings in specimens from cervix uteri: Secondary | ICD-10-CM

## 2016-04-18 DIAGNOSIS — Z3492 Encounter for supervision of normal pregnancy, unspecified, second trimester: Secondary | ICD-10-CM

## 2016-04-18 DIAGNOSIS — O09892 Supervision of other high risk pregnancies, second trimester: Secondary | ICD-10-CM

## 2016-04-18 LAB — POCT URINALYSIS DIPSTICK
GLUCOSE UA: NEGATIVE
KETONES UA: NEGATIVE
Leukocytes, UA: NEGATIVE
NITRITE UA: NEGATIVE
RBC UA: NEGATIVE

## 2016-04-18 MED ORDER — HYDROXYPROGESTERONE CAPROATE 250 MG/ML IM OIL
250.0000 mg | TOPICAL_OIL | Freq: Once | INTRAMUSCULAR | Status: AC
  Administered 2016-04-18: 250 mg via INTRAMUSCULAR

## 2016-04-18 NOTE — Progress Notes (Signed)
Patient ID: Beverly Walker, female   DOB: 03-Jun-1976, 40 y.o.   MRN: [id number]  [id number]  Estimated Date of Delivery: 08/04/16 LROB [redacted]w[redacted]d Blood pressure 92/60, pulse 86, weight 164 lb 12.8 oz (74.8 kg), last menstrual period 11/09/2015.    Urine results: notable for trace protein   Chief Complaint  Patient presents with  . Routine Prenatal Visit    colposcopy consent signed, 17 P given    Patient complaints: none. She states she would like to pursue BTL after delivery.   Patient reports good fetal movement. She denies any bleeding, rupture of membranes, or regular contractions.   Refer to the ob flow sheet for FH and FHR.    Physical Examination: General appearance - alert, well appearing, and in no distress                                      Abdomen  -FHR 150 bpm                                                         soft, nontender, nondistended, no masses or organomegaly  Colposcopy Procedure Note  Beverly [id number]. [id number] for colposcopy for low-grade squamous intraepithelial neoplasia (LGSIL - encompassing HPV,mild dysplasia,CIN I) pap smear on 03/28/16.  Discussed role for HPV in cervical dysplasia, need for surveillance.  Patient given informed consent, signed copy in the chart, time out was performed.  Placed in lithotomy position. Cervix viewed with speculum and colposcope after application of acetic acid.   Colposcopy adequate? Yes One acetowhite area with minimal abnormality at 1 o'clock of anterior cervical lip. No biopsies of ECC specimens obtained.    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 Routine preventative health maintenance measures emphasized.                                             Questions were answered. Assessment: LROB [id number]@ 254w4d. 17P today; h/o PTD at 26w followed by PTD ~8 months gestation  2. Colposcopy adequate   3. Discussed BTL options  Plan:   1. Continued routine  obstetrical care 2. Repeat paps after delivery; f/u colposcopy 6 weeks post partum  3. Pt to sign BTL consent in next few visits   F/u in 4 weeks for routine prenatal care, PN2 F/u weekly for 17P   By signing my name below, I, Beverly Feinsteinattest that this documentation has been prepared under the direction and in the presence of Beverly KindMD. Electronically Signed: EvHansel FeinsteinED Scribe. 04/18/16. 12:04 PM.  I personally performed the services described in this documentation, which was SCRIBED in my presence. The recorded information has been reviewed and considered accurate. It has been edited as necessary during review. FEJonnie KindMD

## 2016-04-25 ENCOUNTER — Ambulatory Visit (INDEPENDENT_AMBULATORY_CARE_PROVIDER_SITE_OTHER): Payer: Medicaid Other | Admitting: *Deleted

## 2016-04-25 ENCOUNTER — Encounter: Payer: Self-pay | Admitting: *Deleted

## 2016-04-25 VITALS — BP 110/58 | HR 90 | Ht 65.0 in | Wt 167.0 lb

## 2016-04-25 DIAGNOSIS — O09212 Supervision of pregnancy with history of pre-term labor, second trimester: Secondary | ICD-10-CM

## 2016-04-25 DIAGNOSIS — Z1389 Encounter for screening for other disorder: Secondary | ICD-10-CM

## 2016-04-25 DIAGNOSIS — Z331 Pregnant state, incidental: Secondary | ICD-10-CM

## 2016-04-25 DIAGNOSIS — Z3492 Encounter for supervision of normal pregnancy, unspecified, second trimester: Secondary | ICD-10-CM

## 2016-04-25 DIAGNOSIS — O09892 Supervision of other high risk pregnancies, second trimester: Secondary | ICD-10-CM

## 2016-04-25 LAB — POCT URINALYSIS DIPSTICK
Glucose, UA: NEGATIVE
Ketones, UA: NEGATIVE
LEUKOCYTES UA: NEGATIVE
NITRITE UA: NEGATIVE

## 2016-04-25 MED ORDER — HYDROXYPROGESTERONE CAPROATE 250 MG/ML IM OIL
250.0000 mg | TOPICAL_OIL | Freq: Once | INTRAMUSCULAR | Status: AC
  Administered 2016-04-25: 250 mg via INTRAMUSCULAR

## 2016-04-25 NOTE — Progress Notes (Signed)
Pt here for 17P. Pt tolerated shot well. Return in 1 week for next shot. [place]

## 2016-05-02 ENCOUNTER — Ambulatory Visit: Payer: Medicaid Other

## 2016-05-03 ENCOUNTER — Ambulatory Visit (INDEPENDENT_AMBULATORY_CARE_PROVIDER_SITE_OTHER): Payer: Medicaid Other | Admitting: *Deleted

## 2016-05-03 ENCOUNTER — Encounter: Payer: Self-pay | Admitting: *Deleted

## 2016-05-03 VITALS — BP 110/60 | HR 94 | Temp 98.5°F | Ht 65.0 in | Wt 166.0 lb

## 2016-05-03 DIAGNOSIS — Z3492 Encounter for supervision of normal pregnancy, unspecified, second trimester: Secondary | ICD-10-CM

## 2016-05-03 DIAGNOSIS — Z1389 Encounter for screening for other disorder: Secondary | ICD-10-CM

## 2016-05-03 DIAGNOSIS — O09892 Supervision of other high risk pregnancies, second trimester: Secondary | ICD-10-CM

## 2016-05-03 DIAGNOSIS — R319 Hematuria, unspecified: Secondary | ICD-10-CM

## 2016-05-03 DIAGNOSIS — Z331 Pregnant state, incidental: Secondary | ICD-10-CM

## 2016-05-03 DIAGNOSIS — O09212 Supervision of pregnancy with history of pre-term labor, second trimester: Secondary | ICD-10-CM

## 2016-05-03 LAB — POCT URINALYSIS DIPSTICK
Glucose, UA: NEGATIVE
Ketones, UA: NEGATIVE
Nitrite, UA: NEGATIVE

## 2016-05-03 MED ORDER — HYDROXYPROGESTERONE CAPROATE 250 MG/ML IM OIL
250.0000 mg | TOPICAL_OIL | Freq: Once | INTRAMUSCULAR | Status: AC
  Administered 2016-05-03: 250 mg via INTRAMUSCULAR

## 2016-05-03 NOTE — Progress Notes (Signed)
Pt here for 17P. Pt tolerated shot well. Pt complains of a "chemical smell" with urine and BM's. I dipped urine and it showed blood. Urine sent for culture. Pt was advised results wouldn't be back for 3-5 days. Pt was advised to increase water intake to flush out system. Pt voiced understanding. Return in 1 week for 17P. JSY

## 2016-05-05 LAB — URINE CULTURE: Organism ID, Bacteria: NO GROWTH

## 2016-05-08 ENCOUNTER — Telehealth: Payer: Self-pay | Admitting: Adult Health

## 2016-05-08 NOTE — Telephone Encounter (Signed)
Pt informed of negative urine culture from 05/03/2016.

## 2016-05-10 ENCOUNTER — Ambulatory Visit (INDEPENDENT_AMBULATORY_CARE_PROVIDER_SITE_OTHER): Payer: Medicaid Other | Admitting: *Deleted

## 2016-05-10 ENCOUNTER — Encounter: Payer: Self-pay | Admitting: *Deleted

## 2016-05-10 VITALS — BP 110/60 | HR 100 | Ht 65.0 in | Wt 167.0 lb

## 2016-05-10 DIAGNOSIS — O09893 Supervision of other high risk pregnancies, third trimester: Secondary | ICD-10-CM

## 2016-05-10 DIAGNOSIS — O09213 Supervision of pregnancy with history of pre-term labor, third trimester: Secondary | ICD-10-CM

## 2016-05-10 DIAGNOSIS — Z1389 Encounter for screening for other disorder: Secondary | ICD-10-CM | POA: Diagnosis not present

## 2016-05-10 DIAGNOSIS — Z331 Pregnant state, incidental: Secondary | ICD-10-CM | POA: Diagnosis not present

## 2016-05-10 LAB — POCT URINALYSIS DIPSTICK
Glucose, UA: NEGATIVE
KETONES UA: NEGATIVE
Leukocytes, UA: NEGATIVE
Nitrite, UA: NEGATIVE

## 2016-05-10 MED ORDER — HYDROXYPROGESTERONE CAPROATE 250 MG/ML IM OIL
250.0000 mg | TOPICAL_OIL | Freq: Once | INTRAMUSCULAR | Status: AC
  Administered 2016-05-10: 250 mg via INTRAMUSCULAR

## 2016-05-10 NOTE — Progress Notes (Signed)
Pt here for 17P. Pt tolerated shot well. Return in 1 week for next shot. [place]

## 2016-05-16 ENCOUNTER — Ambulatory Visit (INDEPENDENT_AMBULATORY_CARE_PROVIDER_SITE_OTHER): Payer: Medicaid Other | Admitting: Women's Health

## 2016-05-16 ENCOUNTER — Other Ambulatory Visit: Payer: Medicaid Other

## 2016-05-16 ENCOUNTER — Ambulatory Visit (INDEPENDENT_AMBULATORY_CARE_PROVIDER_SITE_OTHER): Payer: Medicaid Other

## 2016-05-16 VITALS — BP 110/60 | HR 88 | Wt 168.0 lb

## 2016-05-16 DIAGNOSIS — Z331 Pregnant state, incidental: Secondary | ICD-10-CM | POA: Diagnosis not present

## 2016-05-16 DIAGNOSIS — O09522 Supervision of elderly multigravida, second trimester: Secondary | ICD-10-CM | POA: Diagnosis not present

## 2016-05-16 DIAGNOSIS — Z3483 Encounter for supervision of other normal pregnancy, third trimester: Secondary | ICD-10-CM

## 2016-05-16 DIAGNOSIS — Z1389 Encounter for screening for other disorder: Secondary | ICD-10-CM

## 2016-05-16 DIAGNOSIS — O0992 Supervision of high risk pregnancy, unspecified, second trimester: Secondary | ICD-10-CM

## 2016-05-16 DIAGNOSIS — Z3A28 28 weeks gestation of pregnancy: Secondary | ICD-10-CM | POA: Diagnosis not present

## 2016-05-16 DIAGNOSIS — Z131 Encounter for screening for diabetes mellitus: Secondary | ICD-10-CM

## 2016-05-16 DIAGNOSIS — Z8751 Personal history of pre-term labor: Secondary | ICD-10-CM

## 2016-05-16 DIAGNOSIS — O09213 Supervision of pregnancy with history of pre-term labor, third trimester: Secondary | ICD-10-CM | POA: Diagnosis not present

## 2016-05-16 DIAGNOSIS — Z3A29 29 weeks gestation of pregnancy: Secondary | ICD-10-CM | POA: Diagnosis not present

## 2016-05-16 DIAGNOSIS — Z363 Encounter for antenatal screening for malformations: Secondary | ICD-10-CM | POA: Diagnosis not present

## 2016-05-16 LAB — POCT URINALYSIS DIPSTICK
Blood, UA: 2
Glucose, UA: NEGATIVE
Ketones, UA: NEGATIVE
LEUKOCYTES UA: NEGATIVE
NITRITE UA: NEGATIVE
PROTEIN UA: NEGATIVE

## 2016-05-16 MED ORDER — HYDROXYPROGESTERONE CAPROATE 250 MG/ML IM OIL
250.0000 mg | TOPICAL_OIL | Freq: Once | INTRAMUSCULAR | Status: AC
  Administered 2016-05-16: 250 mg via INTRAMUSCULAR

## 2016-05-16 NOTE — Progress Notes (Signed)
[place] 77+4 wks,cephalic,cx 3.6 cm,post pl gr 1,normal ov's bilat,fhr 137 bpm,afi 11.7 cm,efw 1208 g 47%,anatomy complete,no obvious abnormalities seen

## 2016-05-16 NOTE — Progress Notes (Signed)
Low-risk OB appointment [id number] 14w4dEstimated Date of Delivery: 08/04/16 BP 110/60   Pulse 88   Wt 168 lb (76.2 kg)   LMP 11/09/2015   BMI 27.96 kg/m   BP, weight, and urine reviewed.  Refer to obstetrical flow sheet for FH & FHR.  Reports good fm.  Denies regular uc's, lof, vb, or uti s/s. Concerned she's not gaining enough weight, has gained 18lbs to date of recommended 15-25, 2lbs since last visit, reassured she is doing well. Doesn't feel well after drinking glucola- dizzy, walked a lot last night trick-or-treating, then npo after midnight, likely dehydrated and hypoglycemia w/ sugar load. To go straight to get something to eat/drink after finished. Had abnormal AFP- was offered cfDNA but she never got. Discussed today- declines cfDNA/genetic counseling.  Desires interval salpingectomy, reviewed risks/benefits, consent signed today Reviewed ptl s/s, fkc. Recommended Tdap at HD/PCP per CDC guidelines.  Plan:  Continue routine obstetrical care  F/U asap for f/u u/s d/t limited views anatomy u/s, then weekly for 17P- will get rhogam next week as well, then 2wks for next visit d/t hx of early pre-e.   PN2, 17P today

## 2016-05-16 NOTE — Patient Instructions (Addendum)
[place] Pediatricians/Family Doctors:  [facility] [phone]                 [place] [phone] (usually not accepting new patients unless you have family there already, you are always welcome to call and ask)            [facility] [phone] [place]) [phone]   [facility] Pediatricians/Family Doctors:   [place]: [phone]  [facility]: [phone]  [facility] Dr. Buelah Manis  [phone]  Call the office [phone]) or go to [facility] if:  You begin to have strong, frequent contractions  Your water breaks.  Sometimes it is a big gush of fluid, sometimes it is just a trickle that keeps getting your panties wet or running down your legs  You have vaginal bleeding.  It is normal to have a small amount of spotting if your cervix was checked.   You don't feel your baby moving like normal.  If you don't, get you something to eat and drink and lay down and focus on feeling your baby move.  You should feel at least 10 movements in 2 hours.  If you don't, you should call the office or go to [facility].    Tdap Vaccine  It is recommended that you get the Tdap vaccine during the third trimester of EACH pregnancy to help protect your baby from getting pertussis (whooping cough)  27-36 weeks is the BEST time to do this so that you can pass the protection on to your baby. During pregnancy is better than after pregnancy, but if you are unable to get it during pregnancy it will be offered at the hospital.   You can get this vaccine at the health department or your family doctor  Everyone who will be around your baby should also be up-to-date on their vaccines. Adults (who are not pregnant) only need 1 dose of Tdap during adulthood.   Third Trimester of Pregnancy The third trimester is from week 29 through week 42, months 7 through 9. The  third trimester is a time when the fetus is growing rapidly. At the end of the ninth month, the fetus is about 20 inches in length and weighs 6-10 pounds.  BODY CHANGES Your body goes through many changes during pregnancy. The changes vary from woman to woman.   Your weight will continue to increase. You can expect to gain 25-35 pounds (11-16 kg) by the end of the pregnancy.  You may begin to get stretch marks on your hips, abdomen, and breasts.  You may urinate more often because the fetus is moving lower into your pelvis and pressing on your bladder.  You may develop or continue to have heartburn as a result of your pregnancy.  You may develop constipation because certain hormones are causing the muscles that push waste through your intestines to slow down.  You may develop hemorrhoids or swollen, bulging veins (varicose veins).  You may have pelvic pain because of the weight gain and pregnancy hormones relaxing your joints between the bones in your pelvis. Backaches may result from overexertion of the muscles supporting your posture.  You may have changes in your hair. These can include thickening of your hair, rapid growth, and changes in texture. Some women also have hair loss during or after pregnancy, or hair that feels dry or thin. Your hair will most  likely return to normal after your baby is born.  Your breasts will continue to grow and be tender. A yellow discharge may leak from your breasts called colostrum.  Your belly button may stick out.  You may feel short of breath because of your expanding uterus.  You may notice the fetus "dropping," or moving lower in your abdomen.  You may have a bloody mucus discharge. This usually occurs a few days to a week before labor begins.  Your cervix becomes thin and soft (effaced) near your due date. WHAT TO EXPECT AT YOUR PRENATAL EXAMS  You will have prenatal exams every 2 weeks until week 36. Then, you will have weekly prenatal  exams. During a routine prenatal visit:  You will be weighed to make sure you and the fetus are growing normally.  Your blood pressure is taken.  Your abdomen will be measured to track your baby's growth.  The fetal heartbeat will be listened to.  Any test results from the previous visit will be discussed.  You may have a cervical check near your due date to see if you have effaced. At around 36 weeks, your caregiver will check your cervix. At the same time, your caregiver will also perform a test on the secretions of the vaginal tissue. This test is to determine if a type of bacteria, Group B streptococcus, is present. Your caregiver will explain this further. Your caregiver may ask you:  What your birth plan is.  How you are feeling.  If you are feeling the baby move.  If you have had any abnormal symptoms, such as leaking fluid, bleeding, severe headaches, or abdominal cramping.  If you have any questions. Other tests or screenings that may be performed during your third trimester include:  Blood tests that check for low iron levels (anemia).  Fetal testing to check the health, activity level, and growth of the fetus. Testing is done if you have certain medical conditions or if there are problems during the pregnancy. FALSE LABOR You may feel small, irregular contractions that eventually go away. These are called Braxton Hicks contractions, or false labor. Contractions may last for hours, days, or even weeks before true labor sets in. If contractions come at regular intervals, intensify, or become painful, it is best to be seen by your caregiver.  SIGNS OF LABOR   Menstrual-like cramps.  Contractions that are 5 minutes apart or less.  Contractions that start on the top of the uterus and spread down to the lower abdomen and back.  A sense of increased pelvic pressure or back pain.  A watery or bloody mucus discharge that comes from the vagina. If you have any of these  signs before the 37th week of pregnancy, call your caregiver right away. You need to go to the hospital to get checked immediately. HOME CARE INSTRUCTIONS   Avoid all smoking, herbs, alcohol, and unprescribed drugs. These chemicals affect the formation and growth of the baby.  Follow your caregiver's instructions regarding medicine use. There are medicines that are either safe or unsafe to take during pregnancy.  Exercise only as directed by your caregiver. Experiencing uterine cramps is a good sign to stop exercising.  Continue to eat regular, healthy meals.  Wear a good support bra for breast tenderness.  Do not use hot tubs, steam rooms, or saunas.  Wear your seat belt at all times when driving.  Avoid raw meat, uncooked cheese, cat litter boxes, and soil used by cats. These carry  germs that can cause birth defects in the baby.  Take your prenatal vitamins.  Try taking a stool softener (if your caregiver approves) if you develop constipation. Eat more high-fiber foods, such as fresh vegetables or fruit and whole grains. Drink plenty of fluids to keep your urine clear or pale yellow.  Take warm sitz baths to soothe any pain or discomfort caused by hemorrhoids. Use hemorrhoid cream if your caregiver approves.  If you develop varicose veins, wear support hose. Elevate your feet for 15 minutes, 3-4 times a day. Limit salt in your diet.  Avoid heavy lifting, wear low heal shoes, and practice good posture.  Rest a lot with your legs elevated if you have leg cramps or low back pain.  Visit your dentist if you have not gone during your pregnancy. Use a soft toothbrush to brush your teeth and be gentle when you floss.  A sexual relationship may be continued unless your caregiver directs you otherwise.  Do not travel far distances unless it is absolutely necessary and only with the approval of your caregiver.  Take prenatal classes to understand, practice, and ask questions about the  labor and delivery.  Make a trial run to the hospital.  Pack your hospital bag.  Prepare the baby's nursery.  Continue to go to all your prenatal visits as directed by your caregiver. SEEK MEDICAL CARE IF:  You are unsure if you are in labor or if your water has broken.  You have dizziness.  You have mild pelvic cramps, pelvic pressure, or nagging pain in your abdominal area.  You have persistent nausea, vomiting, or diarrhea.  You have a bad smelling vaginal discharge.  You have pain with urination. SEEK IMMEDIATE MEDICAL CARE IF:   You have a fever.  You are leaking fluid from your vagina.  You have spotting or bleeding from your vagina.  You have severe abdominal cramping or pain.  You have rapid weight loss or gain.  You have shortness of breath with chest pain.  You notice sudden or extreme swelling of your face, hands, ankles, feet, or legs.  You have not felt your baby move in over an hour.  You have severe headaches that do not go away with medicine.  You have vision changes. Document Released: 06/26/2001 Document Revised: 07/07/2013 Document Reviewed: 09/02/2012 [facility] Patient Information 2015 [place], [place]. This information is not intended to replace advice given to you by your health care provider. Make sure you discuss any questions you have with your health care provider.

## 2016-05-17 LAB — RPR: RPR Ser Ql: NONREACTIVE

## 2016-05-17 LAB — CBC
HEMATOCRIT: 32.5 % — AB (ref 34.0–46.6)
HEMOGLOBIN: 11.2 g/dL (ref 11.1–15.9)
MCH: 31.3 pg (ref 26.6–33.0)
MCHC: 34.5 g/dL (ref 31.5–35.7)
MCV: 91 fL (ref 79–97)
Platelets: 329 10*3/uL (ref 150–379)
RBC: 3.58 x10E6/uL — AB (ref 3.77–5.28)
RDW: 13.2 % (ref 12.3–15.4)
WBC: 14.7 10*3/uL — ABNORMAL HIGH (ref 3.4–10.8)

## 2016-05-17 LAB — GLUCOSE TOLERANCE, 2 HOURS W/ 1HR
GLUCOSE, 1 HOUR: 128 mg/dL (ref 65–179)
GLUCOSE, 2 HOUR: 99 mg/dL (ref 65–152)
Glucose, Fasting: 77 mg/dL (ref 65–91)

## 2016-05-17 LAB — HIV ANTIBODY (ROUTINE TESTING W REFLEX): HIV Screen 4th Generation wRfx: NONREACTIVE

## 2016-05-17 LAB — ANTIBODY SCREEN: Antibody Screen: NEGATIVE

## 2016-05-23 ENCOUNTER — Encounter: Payer: Self-pay | Admitting: *Deleted

## 2016-05-23 ENCOUNTER — Ambulatory Visit (INDEPENDENT_AMBULATORY_CARE_PROVIDER_SITE_OTHER): Payer: Medicaid Other | Admitting: *Deleted

## 2016-05-23 VITALS — BP 110/60 | HR 90 | Ht 65.0 in | Wt 170.0 lb

## 2016-05-23 DIAGNOSIS — O09893 Supervision of other high risk pregnancies, third trimester: Secondary | ICD-10-CM

## 2016-05-23 DIAGNOSIS — Z3483 Encounter for supervision of other normal pregnancy, third trimester: Secondary | ICD-10-CM

## 2016-05-23 DIAGNOSIS — O09213 Supervision of pregnancy with history of pre-term labor, third trimester: Secondary | ICD-10-CM | POA: Diagnosis not present

## 2016-05-23 DIAGNOSIS — Z6791 Unspecified blood type, Rh negative: Secondary | ICD-10-CM

## 2016-05-23 DIAGNOSIS — O360131 Maternal care for anti-D [Rh] antibodies, third trimester, fetus 1: Secondary | ICD-10-CM | POA: Diagnosis not present

## 2016-05-23 DIAGNOSIS — Z1389 Encounter for screening for other disorder: Secondary | ICD-10-CM | POA: Diagnosis not present

## 2016-05-23 DIAGNOSIS — Z331 Pregnant state, incidental: Secondary | ICD-10-CM | POA: Diagnosis not present

## 2016-05-23 DIAGNOSIS — O26893 Other specified pregnancy related conditions, third trimester: Secondary | ICD-10-CM

## 2016-05-23 LAB — POCT URINALYSIS DIPSTICK
GLUCOSE UA: NEGATIVE
KETONES UA: NEGATIVE
LEUKOCYTES UA: NEGATIVE
NITRITE UA: NEGATIVE
Protein, UA: NEGATIVE

## 2016-05-23 MED ORDER — HYDROXYPROGESTERONE CAPROATE 250 MG/ML IM OIL
250.0000 mg | TOPICAL_OIL | Freq: Once | INTRAMUSCULAR | Status: AC
  Administered 2016-05-23: 250 mg via INTRAMUSCULAR

## 2016-05-23 MED ORDER — RHO D IMMUNE GLOBULIN 1500 UNITS IM SOSY
1500.0000 [IU] | PREFILLED_SYRINGE | Freq: Once | INTRAMUSCULAR | Status: AC
  Administered 2016-05-23: 1500 [IU] via INTRAMUSCULAR

## 2016-05-23 NOTE — Progress Notes (Signed)
Pt here for 17P and Rhogam. Pt tolerated shots well. Return in 1 week for next 17P. [place]

## 2016-05-30 ENCOUNTER — Ambulatory Visit (INDEPENDENT_AMBULATORY_CARE_PROVIDER_SITE_OTHER): Payer: Medicaid Other | Admitting: Advanced Practice Midwife

## 2016-05-30 ENCOUNTER — Encounter: Payer: Self-pay | Admitting: Advanced Practice Midwife

## 2016-05-30 VITALS — BP 100/62 | HR 92 | Wt 168.5 lb

## 2016-05-30 DIAGNOSIS — Z331 Pregnant state, incidental: Secondary | ICD-10-CM | POA: Diagnosis not present

## 2016-05-30 DIAGNOSIS — Z3483 Encounter for supervision of other normal pregnancy, third trimester: Secondary | ICD-10-CM | POA: Diagnosis not present

## 2016-05-30 DIAGNOSIS — O09523 Supervision of elderly multigravida, third trimester: Secondary | ICD-10-CM | POA: Diagnosis not present

## 2016-05-30 DIAGNOSIS — Z1389 Encounter for screening for other disorder: Secondary | ICD-10-CM | POA: Diagnosis not present

## 2016-05-30 DIAGNOSIS — F1290 Cannabis use, unspecified, uncomplicated: Secondary | ICD-10-CM

## 2016-05-30 DIAGNOSIS — O09293 Supervision of pregnancy with other poor reproductive or obstetric history, third trimester: Secondary | ICD-10-CM

## 2016-05-30 DIAGNOSIS — O99323 Drug use complicating pregnancy, third trimester: Secondary | ICD-10-CM | POA: Diagnosis not present

## 2016-05-30 DIAGNOSIS — O09219 Supervision of pregnancy with history of pre-term labor, unspecified trimester: Secondary | ICD-10-CM

## 2016-05-30 DIAGNOSIS — O09529 Supervision of elderly multigravida, unspecified trimester: Secondary | ICD-10-CM

## 2016-05-30 DIAGNOSIS — O0933 Supervision of pregnancy with insufficient antenatal care, third trimester: Secondary | ICD-10-CM | POA: Diagnosis not present

## 2016-05-30 DIAGNOSIS — Z3A31 31 weeks gestation of pregnancy: Secondary | ICD-10-CM | POA: Diagnosis not present

## 2016-05-30 DIAGNOSIS — O0930 Supervision of pregnancy with insufficient antenatal care, unspecified trimester: Secondary | ICD-10-CM

## 2016-05-30 DIAGNOSIS — O09899 Supervision of other high risk pregnancies, unspecified trimester: Secondary | ICD-10-CM

## 2016-05-30 DIAGNOSIS — O09299 Supervision of pregnancy with other poor reproductive or obstetric history, unspecified trimester: Secondary | ICD-10-CM

## 2016-05-30 LAB — POCT URINALYSIS DIPSTICK
Glucose, UA: NEGATIVE
Ketones, UA: NEGATIVE
Leukocytes, UA: NEGATIVE
NITRITE UA: NEGATIVE

## 2016-05-30 MED ORDER — HYDROXYPROGESTERONE CAPROATE 250 MG/ML IM OIL
250.0000 mg | TOPICAL_OIL | Freq: Once | INTRAMUSCULAR | Status: AC
  Administered 2016-05-30: 250 mg via INTRAMUSCULAR

## 2016-05-30 MED ORDER — OMEPRAZOLE 20 MG PO CPDR: 20.0000 mg | DELAYED_RELEASE_CAPSULE | Freq: Every day | ORAL | 3 refills | Status: DC

## 2016-05-30 NOTE — Patient Instructions (Signed)
Vagal Maneuver Techniques  There are different types of vagal maneuvers that can be used to slow a person's heart rate. There is not one maneuver which will work for all patients. In some instances, it may take a little trial and error to determine what technique works best for an individual patient. All of the procedures or techniques below may stimulate the vagal nerve.  Bearing Down: Bearing down, which medically is referred to as the Valsalva maneuver, is one of the most common ways to stimulate the vagus nerve. The patient is instructed to bear down as if they were having a bowel movement. In effect, the patient is expiring against a closed glottis. An alternative way to perform a Valsalva maneuver is to tell the patient to blow through an occluded straw for several seconds (Hold one end closed and blow through the other). These maneuvers increase intrathoracic pressure and stimulate the vagus nerve.  Coughing: Coughing creates the same physiological response as bearing down, but some people may find it easier to perform. The cough must be forceful and sustained i.e. a single cough will likely not be effective in terminating an arrhythmia  Cold Stimulus to the Face: This technique involves emerging the face in ice cold water. Alternative methods include placing on icepack on the face or a washcloth soaked in ice water. The cold stimuli to the face should last about 10 seconds. This type of vagal maneuver creates a physiological response similar to that which occurs if a person is submerged in cold water (diver's reflex).  Gagging: Although it may not sound pleasant, gagging also stimulates the vagus nerve and can stop an episode of SVT.  A tongue depressor is briefly inserted into the mouth, touching the back of the throat, which causes the person to reflexively gag. The gag reflex stimulates the vagus nerve.

## 2016-05-30 NOTE — Progress Notes (Signed)
[id number] 85w4dEstimated Date of Delivery: 08/04/16  Blood pressure 100/62, pulse 92, weight 168 lb 8 oz (76.4 kg), last menstrual period 11/09/2015.   BP weight and urine results all reviewed and noted.  Please refer to the obstetrical flow sheet for the fundal height and fetal heart rate documentation:  Patient reports good fetal movement, denies any bleeding and no rupture of membranes symptoms or regular contractions. Patient is without complaints other than normal pregnancy complaints. Has some palpitations sometimes.  No dizziness or asso sx.  Still taking Diclegis, has reflux, decrease All questions were answered.  Orders Placed This Encounter  Procedures  . Pain Management Screening Profile (10S)  . POCT urinalysis dipstick    Plan:  Continued routine obstetrical care, rx prilosec  Return in about 2 weeks (around 06/13/2016) for LROB, 17P Weekly.

## 2016-05-31 LAB — PMP SCREEN PROFILE (10S), URINE
AMPHETAMINE SCRN UR: NEGATIVE ng/mL
Barbiturate Screen, Ur: NEGATIVE ng/mL
Benzodiazepine Screen, Urine: NEGATIVE ng/mL
Cannabinoids Ur Ql Scn: POSITIVE ng/mL
Cocaine(Metab.)Screen, Urine: NEGATIVE ng/mL
Creatinine(Crt), U: 124.4 mg/dL (ref 20.0–300.0)
METHADONE SCREEN, URINE: NEGATIVE ng/mL
OXYCODONE+OXYMORPHONE UR QL SCN: NEGATIVE ng/mL
Opiate Scrn, Ur: NEGATIVE ng/mL
PCP SCRN UR: NEGATIVE ng/mL
PH UR, DRUG SCRN: 6.3 (ref 4.5–8.9)
PROPOXYPHENE SCREEN: NEGATIVE ng/mL

## 2016-06-06 ENCOUNTER — Encounter: Payer: Self-pay | Admitting: *Deleted

## 2016-06-06 ENCOUNTER — Ambulatory Visit (INDEPENDENT_AMBULATORY_CARE_PROVIDER_SITE_OTHER): Payer: Medicaid Other | Admitting: *Deleted

## 2016-06-06 VITALS — BP 110/60 | HR 94 | Ht 65.0 in | Wt 170.5 lb

## 2016-06-06 DIAGNOSIS — Z1389 Encounter for screening for other disorder: Secondary | ICD-10-CM

## 2016-06-06 DIAGNOSIS — Z331 Pregnant state, incidental: Secondary | ICD-10-CM

## 2016-06-06 DIAGNOSIS — O09213 Supervision of pregnancy with history of pre-term labor, third trimester: Secondary | ICD-10-CM

## 2016-06-06 DIAGNOSIS — Z3483 Encounter for supervision of other normal pregnancy, third trimester: Secondary | ICD-10-CM

## 2016-06-06 DIAGNOSIS — O09893 Supervision of other high risk pregnancies, third trimester: Secondary | ICD-10-CM

## 2016-06-06 LAB — POCT URINALYSIS DIPSTICK
GLUCOSE UA: NEGATIVE
KETONES UA: NEGATIVE
Leukocytes, UA: NEGATIVE
Nitrite, UA: NEGATIVE

## 2016-06-06 MED ORDER — HYDROXYPROGESTERONE CAPROATE 250 MG/ML IM OIL
250.0000 mg | TOPICAL_OIL | Freq: Once | INTRAMUSCULAR | Status: AC
  Administered 2016-06-06: 250 mg via INTRAMUSCULAR

## 2016-06-06 NOTE — Progress Notes (Signed)
Pt here for 17P. Pt tolerated shot well. Return in 1 week for next shot. Beverly Walker

## 2016-06-13 ENCOUNTER — Ambulatory Visit (INDEPENDENT_AMBULATORY_CARE_PROVIDER_SITE_OTHER): Payer: Medicaid Other | Admitting: Women's Health

## 2016-06-13 ENCOUNTER — Encounter: Payer: Self-pay | Admitting: Women's Health

## 2016-06-13 VITALS — BP 108/60 | HR 80 | Temp 98.1°F | Wt 173.0 lb

## 2016-06-13 DIAGNOSIS — Z3483 Encounter for supervision of other normal pregnancy, third trimester: Secondary | ICD-10-CM

## 2016-06-13 DIAGNOSIS — O09213 Supervision of pregnancy with history of pre-term labor, third trimester: Secondary | ICD-10-CM

## 2016-06-13 DIAGNOSIS — Z1389 Encounter for screening for other disorder: Secondary | ICD-10-CM | POA: Diagnosis not present

## 2016-06-13 DIAGNOSIS — Z3A33 33 weeks gestation of pregnancy: Secondary | ICD-10-CM

## 2016-06-13 DIAGNOSIS — O26899 Other specified pregnancy related conditions, unspecified trimester: Secondary | ICD-10-CM

## 2016-06-13 DIAGNOSIS — Z331 Pregnant state, incidental: Secondary | ICD-10-CM

## 2016-06-13 DIAGNOSIS — O09893 Supervision of other high risk pregnancies, third trimester: Secondary | ICD-10-CM | POA: Diagnosis not present

## 2016-06-13 DIAGNOSIS — O99513 Diseases of the respiratory system complicating pregnancy, third trimester: Secondary | ICD-10-CM

## 2016-06-13 DIAGNOSIS — Z6791 Unspecified blood type, Rh negative: Secondary | ICD-10-CM

## 2016-06-13 DIAGNOSIS — O09523 Supervision of elderly multigravida, third trimester: Secondary | ICD-10-CM

## 2016-06-13 LAB — POCT URINALYSIS DIPSTICK
GLUCOSE UA: NEGATIVE
KETONES UA: NEGATIVE
Leukocytes, UA: NEGATIVE
Nitrite, UA: NEGATIVE
PROTEIN UA: NEGATIVE

## 2016-06-13 MED ORDER — HYDROXYPROGESTERONE CAPROATE 250 MG/ML IM OIL
250.0000 mg | TOPICAL_OIL | Freq: Once | INTRAMUSCULAR | Status: AC
  Administered 2016-06-13: 250 mg via INTRAMUSCULAR

## 2016-06-13 MED ORDER — AZITHROMYCIN 250 MG PO TABS: ORAL_TABLET | ORAL | 0 refills | Status: DC

## 2016-06-13 NOTE — Patient Instructions (Signed)
Humidifier and saline nasal spray for nasal congestion  Regular robitussin, cough drops for cough  Warm salt water gargles for sore throat  Mucinex with lots of water to help you cough up the mucous in your chest if needed  Drink plenty of fluids and stay hydrated!  Wash your hands frequently.  Call the office [phone]) or go to [facility] if:  You begin to have strong, frequent contractions  Your water breaks.  Sometimes it is a big gush of fluid, sometimes it is just a trickle that keeps getting your panties wet or running down your legs  You have vaginal bleeding.  It is normal to have a small amount of spotting if your cervix was checked.   You don't feel your baby moving like normal.  If you don't, get you something to eat and drink and lay down and focus on feeling your baby move.  You should feel at least 10 movements in 2 hours.  If you don't, you should call the office or go to [facility].    Tdap Vaccine  It is recommended that you get the Tdap vaccine during the third trimester of EACH pregnancy to help protect your baby from getting pertussis (whooping cough)  27-36 weeks is the BEST time to do this so that you can pass the protection on to your baby. During pregnancy is better than after pregnancy, but if you are unable to get it during pregnancy it will be offered at the hospital.   You can get this vaccine at the health department or your family doctor  Everyone who will be around your baby should also be up-to-date on their vaccines. Adults (who are not pregnant) only need 1 dose of Tdap during adulthood.     Preterm Labor and Birth Information Pregnancy normally lasts 39-41 weeks. Preterm labor is when labor starts early. It starts before you have been pregnant for 37 whole weeks. What are the risk factors for preterm labor? Preterm labor is more likely to occur in women who:  Have an infection while pregnant.  Have a cervix that is  short.  Have gone into preterm labor before.  Have had surgery on their cervix.  Are younger than age 11.  Are older than age 10.  Are African American.  Are pregnant with two or more babies.  Take street drugs while pregnant.  Smoke while pregnant.  Do not gain enough weight while pregnant.  Got pregnant right after another pregnancy. What are the symptoms of preterm labor? Symptoms of preterm labor include:  Cramps. The cramps may feel like the cramps some women get during their period. The cramps may happen with watery poop (diarrhea).  Pain in the belly (abdomen).  Pain in the lower back.  Regular contractions or tightening. It may feel like your belly is getting tighter.  Pressure in the lower belly that seems to get stronger.  More fluid (discharge) leaking from the vagina. The fluid may be watery or bloody.  Water breaking. Why is it important to notice signs of preterm labor? Babies who are born early may not be fully developed. They have a higher chance for:  Long-term heart problems.  Long-term lung problems.  Trouble controlling body systems, like breathing.  Bleeding in the brain.  A condition called cerebral palsy.  Learning difficulties.  Death. These risks are highest for babies who are born before 64 weeks of pregnancy. How is preterm labor treated? Treatment depends on:  How long  you were pregnant.  Your condition.  The health of your baby. Treatment may involve:  Having a stitch (suture) placed in your cervix. When you give birth, your cervix opens so the baby can come out. The stitch keeps the cervix from opening too soon.  Staying at the hospital.  Taking or getting medicines, such as:  Hormone medicines.  Medicines to stop contractions.  Medicines to help the baby's lungs develop.  Medicines to prevent your baby from having cerebral palsy. What should I do if I am in preterm labor? If you think you are going into labor  too soon, call your doctor right away. How can I prevent preterm labor?  Do not use any tobacco products.  Examples of these are cigarettes, chewing tobacco, and e-cigarettes.  If you need help quitting, ask your doctor.  Do not use street drugs.  Do not use any medicines unless you ask your doctor if they are safe for you.  Talk with your doctor before taking any herbal supplements.  Make sure you gain enough weight.  Watch for infection. If you think you might have an infection, get it checked right away.  If you have gone into preterm labor before, tell your doctor. This information is not intended to replace advice given to you by your health care provider. Make sure you discuss any questions you have with your health care provider. Document Released: 09/28/2008 Document Revised: 12/13/2015 Document Reviewed: 11/23/2015 Elsevier Interactive Patient Education  2017 [employer].

## 2016-06-13 NOTE — Progress Notes (Signed)
Low-risk OB appointment [id number] 66w4dEstimated Date of Delivery: 08/04/16 BP 108/60   Pulse 80   Temp 98.1 F (36.7 C)   Wt 173 lb (78.5 kg)   LMP 11/09/2015   BMI 28.79 kg/m   BP, weight, and urine reviewed.  Refer to obstetrical flow sheet for FH & FHR.  Reports good fm.  Denies regular uc's, lof, vb, or uti s/s. States she remembers MD at 26wk c/s saying she could never have a baby vaginally, however states Dr. FGlo Herringreviewed records w/ last pregnancy and ok'd TOLAC, so assuming was LTCS. Will request records again today to have on file/verify incision. Still prefers TOLAC Cold x 2 wks, postnasal drainage, sore throat, cough productive of thick yellow phlegm, smoker. Some chills intermittently.  HRRR, LCTAB- will rx zpak, can take mucinex, plenty of water Reviewed ptl s/s, fkc. Recommended Tdap at HD/PCP per CDC guidelines.  Plan:  Continue routine obstetrical care  F/U in 2wks for OB appointment, weekly 17P 17P today

## 2016-06-20 ENCOUNTER — Ambulatory Visit (INDEPENDENT_AMBULATORY_CARE_PROVIDER_SITE_OTHER): Payer: Medicaid Other | Admitting: *Deleted

## 2016-06-20 VITALS — BP 140/82 | HR 100

## 2016-06-20 DIAGNOSIS — O09213 Supervision of pregnancy with history of pre-term labor, third trimester: Secondary | ICD-10-CM

## 2016-06-20 DIAGNOSIS — Z1389 Encounter for screening for other disorder: Secondary | ICD-10-CM

## 2016-06-20 DIAGNOSIS — Z331 Pregnant state, incidental: Secondary | ICD-10-CM

## 2016-06-20 DIAGNOSIS — Z8751 Personal history of pre-term labor: Secondary | ICD-10-CM

## 2016-06-20 LAB — POCT URINALYSIS DIPSTICK
Blood, UA: 2
Glucose, UA: NEGATIVE
Ketones, UA: NEGATIVE
LEUKOCYTES UA: NEGATIVE
NITRITE UA: NEGATIVE

## 2016-06-20 MED ORDER — HYDROXYPROGESTERONE CAPROATE 250 MG/ML IM OIL
250.0000 mg | TOPICAL_OIL | Freq: Once | INTRAMUSCULAR | Status: AC
  Administered 2016-06-20: 250 mg via INTRAMUSCULAR

## 2016-06-20 NOTE — Progress Notes (Signed)
Hydroxyprogesterone Caproate 250 mg IM given left ventrogluteal with no complications. Pt has no complaints at this time. Pt states she has her next appt scheduled for next week.

## 2016-06-27 ENCOUNTER — Ambulatory Visit (INDEPENDENT_AMBULATORY_CARE_PROVIDER_SITE_OTHER): Payer: Medicaid Other | Admitting: Advanced Practice Midwife

## 2016-06-27 VITALS — BP 130/72 | HR 90 | Wt 178.0 lb

## 2016-06-27 DIAGNOSIS — Z3A35 35 weeks gestation of pregnancy: Secondary | ICD-10-CM | POA: Diagnosis not present

## 2016-06-27 DIAGNOSIS — O09213 Supervision of pregnancy with history of pre-term labor, third trimester: Secondary | ICD-10-CM

## 2016-06-27 DIAGNOSIS — O09523 Supervision of elderly multigravida, third trimester: Secondary | ICD-10-CM

## 2016-06-27 DIAGNOSIS — Z3483 Encounter for supervision of other normal pregnancy, third trimester: Secondary | ICD-10-CM

## 2016-06-27 DIAGNOSIS — Z331 Pregnant state, incidental: Secondary | ICD-10-CM

## 2016-06-27 DIAGNOSIS — Z1389 Encounter for screening for other disorder: Secondary | ICD-10-CM | POA: Diagnosis not present

## 2016-06-27 DIAGNOSIS — O09893 Supervision of other high risk pregnancies, third trimester: Secondary | ICD-10-CM | POA: Diagnosis not present

## 2016-06-27 DIAGNOSIS — Z8751 Personal history of pre-term labor: Secondary | ICD-10-CM

## 2016-06-27 LAB — POCT URINALYSIS DIPSTICK
Glucose, UA: NEGATIVE
KETONES UA: NEGATIVE
Leukocytes, UA: NEGATIVE
Nitrite, UA: NEGATIVE
RBC UA: 1

## 2016-06-27 MED ORDER — HYDROXYPROGESTERONE CAPROATE 250 MG/ML IM OIL
250.0000 mg | TOPICAL_OIL | Freq: Once | INTRAMUSCULAR | Status: AC
  Administered 2016-06-27: 250 mg via INTRAMUSCULAR

## 2016-06-27 NOTE — Patient Instructions (Signed)

## 2016-06-27 NOTE — Progress Notes (Signed)
[id number] 77w4dEstimated Date of Delivery: 08/04/16  Blood pressure 130/72, pulse 90, weight 178 lb (80.7 kg), last menstrual period 11/09/2015.   BP weight and urine results all reviewed and noted.  Please refer to the obstetrical flow sheet for the fundal height and fetal heart rate documentation:  Patient reports good fetal movement, denies any bleeding and no rupture of membranes symptoms or regular contractions. Patient is without complaints. All questions were answered.  Orders Placed This Encounter  Procedures  . POCT urinalysis dipstick    Plan:  Continued routine obstetrical care, VBAC consent signed today  Return in about 2 weeks (around 07/11/2016) for LROB and , 17P Weekly.

## 2016-07-04 ENCOUNTER — Encounter: Payer: Self-pay | Admitting: *Deleted

## 2016-07-04 ENCOUNTER — Ambulatory Visit (INDEPENDENT_AMBULATORY_CARE_PROVIDER_SITE_OTHER): Payer: Medicaid Other | Admitting: *Deleted

## 2016-07-04 VITALS — BP 118/60 | HR 96 | Ht 65.0 in | Wt 179.0 lb

## 2016-07-04 DIAGNOSIS — Z3483 Encounter for supervision of other normal pregnancy, third trimester: Secondary | ICD-10-CM

## 2016-07-04 DIAGNOSIS — Z331 Pregnant state, incidental: Secondary | ICD-10-CM

## 2016-07-04 DIAGNOSIS — O09213 Supervision of pregnancy with history of pre-term labor, third trimester: Secondary | ICD-10-CM

## 2016-07-04 DIAGNOSIS — Z1389 Encounter for screening for other disorder: Secondary | ICD-10-CM

## 2016-07-04 DIAGNOSIS — O09893 Supervision of other high risk pregnancies, third trimester: Secondary | ICD-10-CM

## 2016-07-04 LAB — POCT URINALYSIS DIPSTICK
GLUCOSE UA: NEGATIVE
Ketones, UA: NEGATIVE
Leukocytes, UA: NEGATIVE
Nitrite, UA: NEGATIVE

## 2016-07-04 MED ORDER — HYDROXYPROGESTERONE CAPROATE 250 MG/ML IM OIL
250.0000 mg | TOPICAL_OIL | Freq: Once | INTRAMUSCULAR | Status: AC
  Administered 2016-07-04: 250 mg via INTRAMUSCULAR

## 2016-07-04 NOTE — Progress Notes (Signed)
Pt here for 17P. Pt tolerated shot well. Return in 1 week for ob visit. [place]

## 2016-07-11 ENCOUNTER — Encounter: Payer: Self-pay | Admitting: Obstetrics and Gynecology

## 2016-07-11 ENCOUNTER — Ambulatory Visit (INDEPENDENT_AMBULATORY_CARE_PROVIDER_SITE_OTHER): Payer: Medicaid Other | Admitting: Obstetrics and Gynecology

## 2016-07-11 VITALS — BP 113/81 | HR 88 | Wt 180.2 lb

## 2016-07-11 DIAGNOSIS — Z3A37 37 weeks gestation of pregnancy: Secondary | ICD-10-CM

## 2016-07-11 DIAGNOSIS — Z3483 Encounter for supervision of other normal pregnancy, third trimester: Secondary | ICD-10-CM

## 2016-07-11 DIAGNOSIS — Z331 Pregnant state, incidental: Secondary | ICD-10-CM | POA: Diagnosis not present

## 2016-07-11 DIAGNOSIS — O09893 Supervision of other high risk pregnancies, third trimester: Secondary | ICD-10-CM

## 2016-07-11 DIAGNOSIS — O09523 Supervision of elderly multigravida, third trimester: Secondary | ICD-10-CM

## 2016-07-11 DIAGNOSIS — O09213 Supervision of pregnancy with history of pre-term labor, third trimester: Secondary | ICD-10-CM | POA: Diagnosis not present

## 2016-07-11 DIAGNOSIS — Z1389 Encounter for screening for other disorder: Secondary | ICD-10-CM

## 2016-07-11 DIAGNOSIS — Z3403 Encounter for supervision of normal first pregnancy, third trimester: Secondary | ICD-10-CM

## 2016-07-11 DIAGNOSIS — Z3685 Encounter for antenatal screening for Streptococcus B: Secondary | ICD-10-CM

## 2016-07-11 LAB — POCT URINALYSIS DIPSTICK
GLUCOSE UA: NEGATIVE
Ketones, UA: NEGATIVE
LEUKOCYTES UA: NEGATIVE
NITRITE UA: NEGATIVE
Protein, UA: NEGATIVE

## 2016-07-11 NOTE — Progress Notes (Signed)
Patient ID: Beverly Walker, female   DOB: July 07, 1976, 40 y.o.   MRN: [id number]  [id number]  Estimated Date of Delivery: 08/04/16 LROB [redacted]w[redacted]d Blood pressure 113/81, pulse 88, weight 180 lb 3.2 oz (81.7 kg), last menstrual period 11/09/2015.    Urine results: notable for moderate blood   Chief Complaint  Patient presents with  . Routine Prenatal Visit    Patient complaints: none.  Patient reports good fetal movement. She denies any bleeding, rupture of membranes, or regular contractions.   Refer to the ob flow sheet for FH and FHR.    Physical Examination: General appearance - alert, well appearing, and in no distress                                      Abdomen - FHR 145 bpm                                                         soft, nontender, nondistended, no masses or organomegaly                                      Pelvic - VULVA: normal appearing vulva with no masses, tenderness or lesions,      VAGINA: normal appearing vagina with normal color and discharge, no lesions,      CERVIX: normal appearing cervix without discharge or lesions, closed                                            GBS, GC/CHL collected   Questions were answered. Assessment: LROB [id number]@ [id number] today; h/o PTD at 26w followed by PTD [id number]:   1. Continued routine obstetrical care 2. Repeat pap smear after delivery; 04/18/16 colpo impression CIN-1 @ 1 o'clock   F/u in 1 weeks for routine prenatal care   By signing my name below, I, EvaHansel Feinsteinttest that this documentation has been prepared under the direction and in the presence of JohJonnie KindD. Electronically Signed: EvaHansel FeinsteinD Scribe. 07/11/16. 11:11 AM.  I personally performed the services described in this documentation, which was SCRIBED in my presence. The recorded information has been reviewed and considered accurate. It has been edited as necessary during review. FERJonnie KindD

## 2016-07-13 LAB — GC/CHLAMYDIA PROBE AMP
Chlamydia trachomatis, NAA: NEGATIVE
NEISSERIA GONORRHOEAE BY PCR: NEGATIVE

## 2016-07-15 LAB — CULTURE, BETA STREP (GROUP B ONLY): Strep Gp B Culture: NEGATIVE

## 2016-07-16 NOTE — L&D Delivery Note (Cosign Needed)
41 y.o. [id number] at 71w2ddelivered a viable female infant in cephalic, LOA position. 1x nuchal cord, easily reduced. Anterior shoulder delivered with ease. 60 sec delayed cord clamping. Cord clamped x2 and cut. Placenta delivered spontaneously intact, with 3VC. Fundus firm on exam with massage and pitocin. Good hemostasis noted.  Laceration: 1st degree right labial Suture: 3.0 Vicryl Good hemostasis noted. EBL 100cc  Mom and baby recovering in LDR.    Apgars: 9/9 Weight: pending    DEloise Levels MD PGY-1 08/06/2016, 3:08 AM

## 2016-07-18 ENCOUNTER — Encounter: Payer: Medicaid Other | Admitting: Advanced Practice Midwife

## 2016-07-19 ENCOUNTER — Encounter: Payer: Self-pay | Admitting: Women's Health

## 2016-07-19 ENCOUNTER — Ambulatory Visit (INDEPENDENT_AMBULATORY_CARE_PROVIDER_SITE_OTHER): Payer: Medicaid Other | Admitting: Women's Health

## 2016-07-19 VITALS — BP 108/63 | HR 97 | Wt 182.0 lb

## 2016-07-19 DIAGNOSIS — Z1389 Encounter for screening for other disorder: Secondary | ICD-10-CM | POA: Diagnosis not present

## 2016-07-19 DIAGNOSIS — Z3483 Encounter for supervision of other normal pregnancy, third trimester: Secondary | ICD-10-CM

## 2016-07-19 DIAGNOSIS — O34219 Maternal care for unspecified type scar from previous cesarean delivery: Secondary | ICD-10-CM | POA: Diagnosis not present

## 2016-07-19 DIAGNOSIS — O09213 Supervision of pregnancy with history of pre-term labor, third trimester: Secondary | ICD-10-CM | POA: Diagnosis not present

## 2016-07-19 DIAGNOSIS — Z3A38 38 weeks gestation of pregnancy: Secondary | ICD-10-CM

## 2016-07-19 DIAGNOSIS — Z331 Pregnant state, incidental: Secondary | ICD-10-CM

## 2016-07-19 DIAGNOSIS — O99333 Smoking (tobacco) complicating pregnancy, third trimester: Secondary | ICD-10-CM

## 2016-07-19 DIAGNOSIS — O09523 Supervision of elderly multigravida, third trimester: Secondary | ICD-10-CM

## 2016-07-19 LAB — POCT URINALYSIS DIPSTICK
Glucose, UA: NEGATIVE
KETONES UA: NEGATIVE
LEUKOCYTES UA: NEGATIVE
Nitrite, UA: NEGATIVE
Protein, UA: NEGATIVE

## 2016-07-19 NOTE — Progress Notes (Signed)
Low-risk OB appointment [id number] 18w5dEstimated Date of Delivery: 08/04/16 BP 108/63   Pulse 97   Wt 182 lb (82.6 kg)   LMP 11/09/2015   BMI 30.29 kg/m   BP, weight, and urine reviewed.  Refer to obstetrical flow sheet for FH & FHR.  Reports good fm.  Denies regular uc's, lof, vb, or uti s/s. No complaints.  SVE per request: 3/80/-1, vtx Reviewed labor s/s, fkc, gbs neg. Plan:  Continue routine obstetrical care  F/U in 1w for OB appointment

## 2016-07-19 NOTE — Patient Instructions (Signed)
Call the office [phone]) or go to [facility] if:  You begin to have strong, frequent contractions  Your water breaks.  Sometimes it is a big gush of fluid, sometimes it is just a trickle that keeps getting your panties wet or running down your legs  You have vaginal bleeding.  It is normal to have a small amount of spotting if your cervix was checked.   You don't feel your baby moving like normal.  If you don't, get you something to eat and drink and lay down and focus on feeling your baby move.  You should feel at least 10 movements in 2 hours.  If you don't, you should call the office or go to [place] Contractions Contractions of the uterus can occur throughout pregnancy. Contractions are not always a sign that you are in labor.  WHAT ARE BRAXTON HICKS CONTRACTIONS?  Contractions that occur before labor are called Braxton Hicks contractions, or false labor. Toward the end of pregnancy (32-34 weeks), these contractions can develop more often and may become more forceful. This is not true labor because these contractions do not result in opening (dilatation) and thinning of the cervix. They are sometimes difficult to tell apart from true labor because these contractions can be forceful and people have different pain tolerances. You should not feel embarrassed if you go to the hospital with false labor. Sometimes, the only way to tell if you are in true labor is for your health care provider to look for changes in the cervix. If there are no prenatal problems or other health problems associated with the pregnancy, it is completely safe to be sent home with false labor and await the onset of true labor. HOW CAN YOU TELL THE DIFFERENCE BETWEEN TRUE AND FALSE LABOR? False Labor   The contractions of false labor are usually shorter and not as hard as those of true labor.   The contractions are usually irregular.   The contractions are often felt in the front of  the lower abdomen and in the groin.   The contractions may go away when you walk around or change positions while lying down.   The contractions get weaker and are shorter lasting as time goes on.   The contractions do not usually become progressively stronger, regular, and closer together as with true labor.  True Labor   Contractions in true labor last 30-70 seconds, become very regular, usually become more intense, and increase in frequency.   The contractions do not go away with walking.   The discomfort is usually felt in the top of the uterus and spreads to the lower abdomen and low back.   True labor can be determined by your health care provider with an exam. This will show that the cervix is dilating and getting thinner.  WHAT TO REMEMBER  Keep up with your usual exercises and follow other instructions given by your health care provider.   Take medicines as directed by your health care provider.   Keep your regular prenatal appointments.   Eat and drink lightly if you think you are going into labor.   If Braxton Hicks contractions are making you uncomfortable:   Change your position from lying down or resting to walking, or from walking to resting.   Sit and rest in a tub of warm water.   Drink 2-3 glasses of water. Dehydration may cause these contractions.   Do slow and deep breathing several  times an hour.  WHEN SHOULD I SEEK IMMEDIATE MEDICAL CARE? Seek immediate medical care if:  Your contractions become stronger, more regular, and closer together.   You have fluid leaking or gushing from your vagina.   You have a fever.   You pass blood-tinged mucus.   You have vaginal bleeding.   You have continuous abdominal pain.   You have low back pain that you never had before.   You feel your baby's head pushing down and causing pelvic pressure.   Your baby is not moving as much as it used to.  This information is not intended to  replace advice given to you by your health care provider. Make sure you discuss any questions you have with your health care provider. Document Released: 07/02/2005 Document Revised: 10/24/2015 Document Reviewed: 04/13/2013 Elsevier Interactive Patient Education  2017 [employer].

## 2016-07-26 ENCOUNTER — Encounter: Payer: Self-pay | Admitting: Obstetrics & Gynecology

## 2016-07-26 ENCOUNTER — Ambulatory Visit (INDEPENDENT_AMBULATORY_CARE_PROVIDER_SITE_OTHER): Payer: Medicaid Other | Admitting: Obstetrics & Gynecology

## 2016-07-26 VITALS — BP 104/78 | HR 108 | Wt 184.0 lb

## 2016-07-26 DIAGNOSIS — O09523 Supervision of elderly multigravida, third trimester: Secondary | ICD-10-CM | POA: Diagnosis not present

## 2016-07-26 DIAGNOSIS — Z331 Pregnant state, incidental: Secondary | ICD-10-CM | POA: Diagnosis not present

## 2016-07-26 DIAGNOSIS — O99333 Smoking (tobacco) complicating pregnancy, third trimester: Secondary | ICD-10-CM

## 2016-07-26 DIAGNOSIS — O99323 Drug use complicating pregnancy, third trimester: Secondary | ICD-10-CM | POA: Diagnosis not present

## 2016-07-26 DIAGNOSIS — Z3483 Encounter for supervision of other normal pregnancy, third trimester: Secondary | ICD-10-CM

## 2016-07-26 DIAGNOSIS — O09213 Supervision of pregnancy with history of pre-term labor, third trimester: Secondary | ICD-10-CM | POA: Diagnosis not present

## 2016-07-26 DIAGNOSIS — Z1389 Encounter for screening for other disorder: Secondary | ICD-10-CM

## 2016-07-26 DIAGNOSIS — Z3A39 39 weeks gestation of pregnancy: Secondary | ICD-10-CM

## 2016-07-26 LAB — POCT URINALYSIS DIPSTICK
GLUCOSE UA: NEGATIVE
Ketones, UA: NEGATIVE
Leukocytes, UA: NEGATIVE
NITRITE UA: NEGATIVE
Protein, UA: NEGATIVE
RBC UA: 1

## 2016-07-26 NOTE — Progress Notes (Signed)
[id number] 52w5dEstimated Date of Delivery: 08/04/16  Blood pressure 104/78, pulse (!) 108, weight 184 lb (83.5 kg), last menstrual period 11/09/2015.   BP weight and urine results all reviewed and noted.  Please refer to the obstetrical flow sheet for the fundal height and fetal heart rate documentation:  Patient reports good fetal movement, denies any bleeding and no rupture of membranes symptoms or regular contractions. Patient is without complaints. All questions were answered.  Orders Placed This Encounter  Procedures  . POCT urinalysis dipstick    Plan:  Continued routine obstetrical care,   Return in about 1 week (around 08/02/2016).

## 2016-08-02 ENCOUNTER — Encounter: Payer: Medicaid Other | Admitting: Women's Health

## 2016-08-03 ENCOUNTER — Encounter: Payer: Self-pay | Admitting: Women's Health

## 2016-08-03 ENCOUNTER — Ambulatory Visit (INDEPENDENT_AMBULATORY_CARE_PROVIDER_SITE_OTHER): Payer: Medicaid Other | Admitting: Women's Health

## 2016-08-03 VITALS — BP 105/73 | HR 89 | Temp 97.9°F | Wt 180.0 lb

## 2016-08-03 DIAGNOSIS — O09523 Supervision of elderly multigravida, third trimester: Secondary | ICD-10-CM | POA: Diagnosis not present

## 2016-08-03 DIAGNOSIS — H6691 Otitis media, unspecified, right ear: Secondary | ICD-10-CM

## 2016-08-03 DIAGNOSIS — Z1389 Encounter for screening for other disorder: Secondary | ICD-10-CM

## 2016-08-03 DIAGNOSIS — Z331 Pregnant state, incidental: Secondary | ICD-10-CM | POA: Diagnosis not present

## 2016-08-03 DIAGNOSIS — Z3A40 40 weeks gestation of pregnancy: Secondary | ICD-10-CM | POA: Diagnosis not present

## 2016-08-03 DIAGNOSIS — O09213 Supervision of pregnancy with history of pre-term labor, third trimester: Secondary | ICD-10-CM | POA: Diagnosis not present

## 2016-08-03 DIAGNOSIS — Z3483 Encounter for supervision of other normal pregnancy, third trimester: Secondary | ICD-10-CM

## 2016-08-03 DIAGNOSIS — J0120 Acute ethmoidal sinusitis, unspecified: Secondary | ICD-10-CM

## 2016-08-03 DIAGNOSIS — O99513 Diseases of the respiratory system complicating pregnancy, third trimester: Secondary | ICD-10-CM | POA: Diagnosis not present

## 2016-08-03 LAB — POCT URINALYSIS DIPSTICK
Glucose, UA: NEGATIVE
Ketones, UA: NEGATIVE
LEUKOCYTES UA: NEGATIVE
NITRITE UA: NEGATIVE
PROTEIN UA: NEGATIVE

## 2016-08-03 MED ORDER — AMOXICILLIN-POT CLAVULANATE 875-125 MG PO TABS: 1.0000 | ORAL_TABLET | Freq: Two times a day (BID) | ORAL | 0 refills | Status: DC

## 2016-08-03 NOTE — Progress Notes (Signed)
Low-risk OB appointment [id number] 61w6dEstimated Date of Delivery: 08/04/16 BP 105/73   Pulse 89   Temp 97.9 F (36.6 C)   Wt 180 lb (81.6 kg)   LMP 11/09/2015   BMI 29.95 kg/m   BP, weight, and urine reviewed.  Refer to obstetrical flow sheet for FH & FHR.  Reports good fm.  Denies regular uc's, lof, vb, or uti s/s. Cold/congestion/sinus pressure/Rt earache since Tues, Lt ear starting to hurt now too. Smoker.  Lt ear normal, Rt ear erythematous w/ +fluid +ethymoid sinus tenderness Throat clear w/o exudate HRRR, LCTAB Rx augmentin bid x 7d for Rt otitis media and sinusitis SVE: 4/80/-2, vtx, declines membrane sweeping Reviewed labor s/s, fkc. Plan:  Continue routine obstetrical care, IOL scheduled for 1/27 @ 0800 if needed for postdates F/U in 1wk for OB appointment

## 2016-08-03 NOTE — Patient Instructions (Signed)
Call the office [phone]) or go to [facility] if:  You begin to have strong, frequent contractions  Your water breaks.  Sometimes it is a big gush of fluid, sometimes it is just a trickle that keeps getting your panties wet or running down your legs  You have vaginal bleeding.  It is normal to have a small amount of spotting if your cervix was checked.   You don't feel your baby moving like normal.  If you don't, get you something to eat and drink and lay down and focus on feeling your baby move.  You should feel at least 10 movements in 2 hours.  If you don't, you should call the office or go to [place] Contractions Contractions of the uterus can occur throughout pregnancy. Contractions are not always a sign that you are in labor.  WHAT ARE BRAXTON HICKS CONTRACTIONS?  Contractions that occur before labor are called Braxton Hicks contractions, or false labor. Toward the end of pregnancy (32-34 weeks), these contractions can develop more often and may become more forceful. This is not true labor because these contractions do not result in opening (dilatation) and thinning of the cervix. They are sometimes difficult to tell apart from true labor because these contractions can be forceful and people have different pain tolerances. You should not feel embarrassed if you go to the hospital with false labor. Sometimes, the only way to tell if you are in true labor is for your health care provider to look for changes in the cervix. If there are no prenatal problems or other health problems associated with the pregnancy, it is completely safe to be sent home with false labor and await the onset of true labor. HOW CAN YOU TELL THE DIFFERENCE BETWEEN TRUE AND FALSE LABOR? False Labor   The contractions of false labor are usually shorter and not as hard as those of true labor.   The contractions are usually irregular.   The contractions are often felt in the front of  the lower abdomen and in the groin.   The contractions may go away when you walk around or change positions while lying down.   The contractions get weaker and are shorter lasting as time goes on.   The contractions do not usually become progressively stronger, regular, and closer together as with true labor.  True Labor   Contractions in true labor last 30-70 seconds, become very regular, usually become more intense, and increase in frequency.   The contractions do not go away with walking.   The discomfort is usually felt in the top of the uterus and spreads to the lower abdomen and low back.   True labor can be determined by your health care provider with an exam. This will show that the cervix is dilating and getting thinner.  WHAT TO REMEMBER  Keep up with your usual exercises and follow other instructions given by your health care provider.   Take medicines as directed by your health care provider.   Keep your regular prenatal appointments.   Eat and drink lightly if you think you are going into labor.   If Braxton Hicks contractions are making you uncomfortable:   Change your position from lying down or resting to walking, or from walking to resting.   Sit and rest in a tub of warm water.   Drink 2-3 glasses of water. Dehydration may cause these contractions.   Do slow and deep breathing several  times an hour.  WHEN SHOULD I SEEK IMMEDIATE MEDICAL CARE? Seek immediate medical care if:  Your contractions become stronger, more regular, and closer together.   You have fluid leaking or gushing from your vagina.   You have a fever.   You pass blood-tinged mucus.   You have vaginal bleeding.   You have continuous abdominal pain.   You have low back pain that you never had before.   You feel your baby's head pushing down and causing pelvic pressure.   Your baby is not moving as much as it used to.  This information is not intended to  replace advice given to you by your health care provider. Make sure you discuss any questions you have with your health care provider. Document Released: 07/02/2005 Document Revised: 10/24/2015 Document Reviewed: 04/13/2013 Elsevier Interactive Patient Education  2017 [employer].

## 2016-08-05 ENCOUNTER — Inpatient Hospital Stay (HOSPITAL_COMMUNITY): Payer: Medicaid Other | Admitting: Anesthesiology

## 2016-08-05 ENCOUNTER — Encounter (HOSPITAL_COMMUNITY): Payer: Self-pay | Admitting: *Deleted

## 2016-08-05 ENCOUNTER — Inpatient Hospital Stay (HOSPITAL_COMMUNITY)
Admission: AD | Admit: 2016-08-05 | Discharge: 2016-08-07 | DRG: 775 | Disposition: A | Discharge status: Home / Self Care | Payer: Medicaid Other | Source: Ambulatory Visit | Attending: Obstetrics & Gynecology | Admitting: Obstetrics & Gynecology

## 2016-08-05 DIAGNOSIS — F17210 Nicotine dependence, cigarettes, uncomplicated: Secondary | ICD-10-CM | POA: Diagnosis present

## 2016-08-05 DIAGNOSIS — O4202 Full-term premature rupture of membranes, onset of labor within 24 hours of rupture: Secondary | ICD-10-CM | POA: Diagnosis present

## 2016-08-05 DIAGNOSIS — O99334 Smoking (tobacco) complicating childbirth: Secondary | ICD-10-CM | POA: Diagnosis present

## 2016-08-05 DIAGNOSIS — Z3A40 40 weeks gestation of pregnancy: Secondary | ICD-10-CM

## 2016-08-05 DIAGNOSIS — O34211 Maternal care for low transverse scar from previous cesarean delivery: Secondary | ICD-10-CM | POA: Diagnosis present

## 2016-08-05 DIAGNOSIS — Z833 Family history of diabetes mellitus: Secondary | ICD-10-CM | POA: Diagnosis not present

## 2016-08-05 DIAGNOSIS — Z3483 Encounter for supervision of other normal pregnancy, third trimester: Secondary | ICD-10-CM

## 2016-08-05 LAB — CBC
HEMATOCRIT: 36.4 % (ref 36.0–46.0)
HEMOGLOBIN: 13 g/dL (ref 12.0–15.0)
MCH: 31.9 pg (ref 26.0–34.0)
MCHC: 35.7 g/dL (ref 30.0–36.0)
MCV: 89.4 fL (ref 78.0–100.0)
Platelets: 373 10*3/uL (ref 150–400)
RBC: 4.07 MIL/uL (ref 3.87–5.11)
RDW: 13.4 % (ref 11.5–15.5)
WBC: 15.4 10*3/uL — ABNORMAL HIGH (ref 4.0–10.5)

## 2016-08-05 LAB — TYPE AND SCREEN
ABO/RH(D): O NEG
ANTIBODY SCREEN: NEGATIVE

## 2016-08-05 LAB — ABO/RH: ABO/RH(D): O NEG

## 2016-08-05 MED ORDER — DIPHENHYDRAMINE HCL 50 MG/ML IJ SOLN: 12.5000 mg | INTRAMUSCULAR | Status: DC | PRN

## 2016-08-05 MED ORDER — ONDANSETRON HCL 4 MG/2ML IJ SOLN: 4.0000 mg | Freq: Four times a day (QID) | INTRAMUSCULAR | Status: DC | PRN

## 2016-08-05 MED ORDER — OXYCODONE-ACETAMINOPHEN 5-325 MG PO TABS: 2.0000 | ORAL_TABLET | ORAL | Status: DC | PRN

## 2016-08-05 MED ORDER — LACTATED RINGERS IV SOLN
500.0000 mL | Freq: Once | INTRAVENOUS | Status: AC
  Administered 2016-08-05: 500 mL via INTRAVENOUS

## 2016-08-05 MED ORDER — PHENYLEPHRINE 40 MCG/ML (10ML) SYRINGE FOR IV PUSH (FOR BLOOD PRESSURE SUPPORT)
80.0000 ug | PREFILLED_SYRINGE | INTRAVENOUS | Status: DC | PRN
  Filled 2016-08-05: qty 10
  Filled 2016-08-05: qty 5

## 2016-08-05 MED ORDER — OXYCODONE-ACETAMINOPHEN 5-325 MG PO TABS: 1.0000 | ORAL_TABLET | ORAL | Status: DC | PRN

## 2016-08-05 MED ORDER — FENTANYL 2.5 MCG/ML BUPIVACAINE 1/10 % EPIDURAL INFUSION (WH - ANES)
14.0000 mL/h | INTRAMUSCULAR | Status: DC | PRN
  Administered 2016-08-05: 14 mL/h via EPIDURAL
  Filled 2016-08-05: qty 100

## 2016-08-05 MED ORDER — EPHEDRINE 5 MG/ML INJ
10.0000 mg | INTRAVENOUS | Status: DC | PRN
  Filled 2016-08-05: qty 4

## 2016-08-05 MED ORDER — PHENYLEPHRINE 40 MCG/ML (10ML) SYRINGE FOR IV PUSH (FOR BLOOD PRESSURE SUPPORT)
80.0000 ug | PREFILLED_SYRINGE | INTRAVENOUS | Status: DC | PRN
  Filled 2016-08-05: qty 5

## 2016-08-05 MED ORDER — LIDOCAINE HCL (PF) 1 % IJ SOLN
30.0000 mL | INTRAMUSCULAR | Status: DC | PRN
  Filled 2016-08-05: qty 30

## 2016-08-05 MED ORDER — SOD CITRATE-CITRIC ACID 500-334 MG/5ML PO SOLN: 30.0000 mL | ORAL | Status: DC | PRN

## 2016-08-05 MED ORDER — LACTATED RINGERS IV SOLN
INTRAVENOUS | Status: DC
  Administered 2016-08-05 – 2016-08-06 (×3): via INTRAVENOUS

## 2016-08-05 MED ORDER — TERBUTALINE SULFATE 1 MG/ML IJ SOLN
0.2500 mg | Freq: Once | INTRAMUSCULAR | Status: DC | PRN
  Filled 2016-08-05: qty 1

## 2016-08-05 MED ORDER — OXYTOCIN 40 UNITS IN LACTATED RINGERS INFUSION - SIMPLE MED
2.5000 [IU]/h | INTRAVENOUS | Status: DC
  Administered 2016-08-06: 2.5 [IU]/h via INTRAVENOUS

## 2016-08-05 MED ORDER — LIDOCAINE HCL (PF) 1 % IJ SOLN
INTRAMUSCULAR | Status: DC | PRN
  Administered 2016-08-05 (×2): 7 mL via EPIDURAL

## 2016-08-05 MED ORDER — LACTATED RINGERS IV SOLN
500.0000 mL | INTRAVENOUS | Status: DC | PRN
  Administered 2016-08-06: 500 mL via INTRAVENOUS

## 2016-08-05 MED ORDER — OXYTOCIN BOLUS FROM INFUSION
500.0000 mL | Freq: Once | INTRAVENOUS | Status: AC
  Administered 2016-08-06: 500 mL via INTRAVENOUS

## 2016-08-05 MED ORDER — ACETAMINOPHEN 325 MG PO TABS: 650.0000 mg | ORAL_TABLET | ORAL | Status: DC | PRN

## 2016-08-05 MED ORDER — OXYTOCIN 40 UNITS IN LACTATED RINGERS INFUSION - SIMPLE MED
1.0000 m[IU]/min | INTRAVENOUS | Status: DC
  Administered 2016-08-05: 14 m[IU]/min via INTRAVENOUS
  Administered 2016-08-05: 2 m[IU]/min via INTRAVENOUS
  Filled 2016-08-05: qty 1000

## 2016-08-05 NOTE — Progress Notes (Signed)
OB Interim Progress Note  S: Stopped by to see patient. She just had an IUPC placed and is uncomfortable.  Pain is 5/10 and has not requested any pain medication.  Planning to get epidural placed.   O: BP 114/76   Pulse 83   Temp 98 F (36.7 C) (Oral)   Resp 18   Ht 5' 4"  (1.626 m)   Wt 81.6 kg (180 lb)   BMI 30.90 kg/m    Dilation: 5 Effacement (%): 80 Station: -2 Presentation: Vertex Exam by:: Raquel Sarna, RN  FHT: HR: 140, mod variability, pos accels, no decels, contractions q3-32mn  A/P: SROM at 0900 with last cervical check at 5cm 90% effaced and 0 station.  Pitocin at 18 and IUPC tracing inverted and patient is uncomfortable. Pulled IUPC, put patient back on tolac and will plan to increase pitocin. Continue expectant management Anticipate SVD  DEloise Levels MD 08/05/2016, 9:04 PM PGY-1

## 2016-08-05 NOTE — H&P (Signed)
Beverly Walker is a 41 y.o. female [phone] @ 40.1 wks presenting for SROM ar 0930 this morning. Fluid was clear. GBS neg. 1 prev c/s for pre eclapsia at 26 wks and one successful VBAK @ 33 wks for PROM OB History    Gravida Para Term Preterm AB Living   6 2   2 3 2    SAB TAB Ectopic Multiple Live Births           2     Past Medical History:  Diagnosis Date  . Anxiety   . Headache   . Hypertension    pre-eclampsia  . Vaginal Pap smear, abnormal 2005, 2014   abnormal x2 in 2005, ASCUS 2014   Past Surgical History:  Procedure Laterality Date  . CESAREAN SECTION     Family History: family history includes Arthritis in her father, maternal grandmother, and mother; COPD in her sister; Cancer in her paternal grandmother; Diabetes in her maternal grandfather and paternal grandmother; Hyperlipidemia in her mother; Kidney disease in her maternal grandfather; Thyroid disease in her mother. Social History:  reports that she has been smoking Cigarettes.  She has a 24.00 pack-year smoking history. She has never used smokeless tobacco. She reports that she does not drink alcohol or use drugs.     Maternal Diabetes: No Genetic Screening: Normal Maternal Ultrasounds/Referrals: Normal Fetal Ultrasounds or other Referrals:  None Maternal Substance Abuse:  No Significant Maternal Medications:  None Significant Maternal Lab Results:  None Other Comments:  None  Review of Systems  Constitutional: Negative.   HENT: Negative.   Eyes: Negative.   Respiratory: Negative.   Cardiovascular: Negative.   Gastrointestinal: Negative.   Genitourinary: Negative.   Musculoskeletal: Negative.   Skin: Negative.   Neurological: Negative.   Endo/Heme/Allergies: Negative.   Psychiatric/Behavioral: Negative.    Maternal Medical History:  Reason for admission: Rupture of membranes.   Contractions: none  Fetal activity: Perceived fetal activity is normal.   Last perceived fetal movement was within the  past hour.    Prenatal complications: no prenatal complications Prenatal Complications - Diabetes: none.    Dilation: 4 Effacement (%): 80 Station: -2 Exam by:: Devoria Glassing RN Blood pressure 127/77, pulse (!) 130, temperature 97.7 F (36.5 C), resp. rate 18, height 5' 4"  (1.626 m), weight 180 lb (81.6 kg). Maternal Exam:  Abdomen: Patient reports no abdominal tenderness. Fetal presentation: vertex  Introitus: Normal vulva. Normal vagina.  Amniotic fluid character: clear.  Pelvis: adequate for delivery.   Cervix: Cervix evaluated by digital exam.     Fetal Exam Fetal Monitor Review: Mode: ultrasound.   Variability: moderate (6-25 bpm).   Pattern: accelerations present.    Fetal State Assessment: Category I - tracings are normal.     Physical Exam  Constitutional: She is oriented to person, place, and time. She appears well-developed and well-nourished.  HENT:  Head: Normocephalic.  Eyes: Pupils are equal, round, and reactive to light.  Neck: Normal range of motion.  Cardiovascular: Normal rate, regular rhythm, normal heart sounds and intact distal pulses.   Respiratory: Breath sounds normal.  GI: Soft. Bowel sounds are normal.  Genitourinary: Vagina normal and uterus normal.  Musculoskeletal: Normal range of motion.  Neurological: She is alert and oriented to person, place, and time. She has normal reflexes.  Skin: Skin is warm and dry.  Psychiatric: She has a normal mood and affect. Her behavior is normal. Judgment and thought content normal.    Prenatal labs: ABO, Rh: O/Negative/-- (09/13  1731) Antibody: Negative (11/01 0906) Rubella: 1.48 (09/13 1731) RPR: Non Reactive (11/01 0906)  HBsAg: Negative (09/13 1731)  HIV: Non Reactive (11/01 0906)  GBS:     Assessment/Plan: SROM @ 0930 clear fluid preg @ 40.1 wks GBS neg SVE 4/60/-2 clear fluid noted. Discussed POC with pt since she is not contracting she desires it aug of labor and epidural.   Koren Shiver 08/05/2016, 12:50 PM

## 2016-08-05 NOTE — Progress Notes (Signed)
Beverly Walker is a 41 y.o. [phone] at 56w1dby ultrasound admitted for rupture of membranes  Subjective:   Objective: BP 116/80   Pulse (!) 107   Temp 97.8 F (36.6 C) (Oral)   Resp 18   Ht 5' 4"  (1.626 m)   Wt 180 lb (81.6 kg)   BMI 30.90 kg/m  No intake/output data recorded. No intake/output data recorded.  FHT:  FHR: 140's bpm, variability: moderate,  accelerations:  Abscent,  decelerations:  Absent UC:   isolated SVE:   Dilation: 4.5 Effacement (%): 80 Station: -1 Exam by:: MRaquel Sarna RN  Labs: Lab Results  Component Value Date   WBC 15.4 (H) 08/05/2016   HGB 13.0 08/05/2016   HCT 36.4 08/05/2016   MCV 89.4 08/05/2016   PLT 373 08/05/2016    Assessment / Plan: yet to be in labor  Labor: yet to be in labor Preeclampsia:  no signs or symptoms of toxicity Fetal Wellbeing:  Category I Pain Control:  Epidural I/D:  n/a Anticipated MOD:  NSVD  MKoren Shiver1/21/2018, 2:32 PM

## 2016-08-05 NOTE — Anesthesia Procedure Notes (Signed)
Epidural Patient location during procedure: OB Start time: 08/05/2016 10:08 PM End time: 08/05/2016 10:12 PM  Staffing Anesthesiologist: Lyn Hollingshead  Preanesthetic Checklist Completed: patient identified, surgical consent, pre-op evaluation, timeout performed, IV checked, risks and benefits discussed and monitors and equipment checked  Epidural Patient position: sitting Prep: site prepped and draped and DuraPrep Patient monitoring: continuous pulse ox and blood pressure Approach: midline Location: L3-L4 Injection technique: LOR air  Needle:  Needle type: Tuohy  Needle gauge: 17 G Needle length: 9 cm and 9 Needle insertion depth: 7 cm Catheter type: closed end flexible Catheter size: 19 Gauge Catheter at skin depth: 12 cm Test dose: negative and Other  Assessment Sensory level: T9 Events: blood not aspirated, injection not painful, no injection resistance, negative IV test and no paresthesia  Additional Notes Reason for block:procedure for pain

## 2016-08-05 NOTE — Anesthesia Pain Management Evaluation Note (Signed)
  CRNA Pain Management Visit Note  Patient: Beverly Walker, 41 y.o., female  "Hello I am a member of the anesthesia team at [facility]. We have an anesthesia team available at all times to provide care throughout the hospital, including epidural management and anesthesia for C-section. I don't know your plan for the delivery whether it a natural birth, water birth, IV sedation, nitrous supplementation, doula or epidural, but we want to meet your pain goals."   1.Was your pain managed to your expectations on prior hospitalizations?   Yes   2.What is your expectation for pain management during this hospitalization?     Epidural  3.How can we help you reach that goal? Patient wants epidural   Record the patient's initial score and the patient's pain goal.   Pain: 3  Pain Goal: 8 [facility] wants you to be able to say your pain was always managed very well.  Rayvon Char 08/05/2016

## 2016-08-05 NOTE — Anesthesia Preprocedure Evaluation (Signed)
Anesthesia Evaluation  Patient identified by MRN, date of birth, ID band Patient awake    Reviewed: Allergy & Precautions, H&P , NPO status , Patient's Chart, lab work & pertinent test results  Airway Mallampati: I  TM Distance: >3 FB Neck ROM: full    Dental no notable dental hx.    Pulmonary Current Smoker,    Pulmonary exam normal        Cardiovascular hypertension, Normal cardiovascular exam     Neuro/Psych    GI/Hepatic negative GI ROS, Neg liver ROS,   Endo/Other  negative endocrine ROS  Renal/GU negative Renal ROS  negative genitourinary   Musculoskeletal negative musculoskeletal ROS (+)   Abdominal (+) + obese,   Peds  Hematology negative hematology ROS (+)   Anesthesia Other Findings   Reproductive/Obstetrics (+) Pregnancy                             Anesthesia Physical Anesthesia Plan  ASA: II  Anesthesia Plan: Epidural   Post-op Pain Management:    Induction:   Airway Management Planned:   Additional Equipment:   Intra-op Plan:   Post-operative Plan:   Informed Consent: I have reviewed the patients History and Physical, chart, labs and discussed the procedure including the risks, benefits and alternatives for the proposed anesthesia with the patient or authorized representative who has indicated his/her understanding and acceptance.     Plan Discussed with:   Anesthesia Plan Comments:         Anesthesia Quick Evaluation

## 2016-08-05 NOTE — Progress Notes (Signed)
Rockwell Alexandria CNM notified of pt's VE, ROM, orders received to admit pt

## 2016-08-06 ENCOUNTER — Encounter (HOSPITAL_COMMUNITY): Payer: Self-pay

## 2016-08-06 DIAGNOSIS — Z3A40 40 weeks gestation of pregnancy: Secondary | ICD-10-CM

## 2016-08-06 LAB — RPR: RPR Ser Ql: NONREACTIVE

## 2016-08-06 MED ORDER — ACETAMINOPHEN 325 MG PO TABS
650.0000 mg | ORAL_TABLET | ORAL | Status: DC | PRN
  Administered 2016-08-06: 650 mg via ORAL
  Filled 2016-08-06: qty 2

## 2016-08-06 MED ORDER — ZOLPIDEM TARTRATE 5 MG PO TABS: 5.0000 mg | ORAL_TABLET | Freq: Every evening | ORAL | Status: DC | PRN

## 2016-08-06 MED ORDER — ASPIRIN EC 81 MG PO TBEC
81.0000 mg | DELAYED_RELEASE_TABLET | Freq: Every day | ORAL | Status: DC
  Administered 2016-08-06 – 2016-08-07 (×2): 81 mg via ORAL
  Filled 2016-08-06 (×3): qty 1

## 2016-08-06 MED ORDER — IBUPROFEN 600 MG PO TABS
600.0000 mg | ORAL_TABLET | Freq: Four times a day (QID) | ORAL | Status: DC
  Administered 2016-08-06 – 2016-08-07 (×6): 600 mg via ORAL
  Filled 2016-08-06 (×5): qty 1

## 2016-08-06 MED ORDER — COCONUT OIL OIL: 1.0000 "application " | TOPICAL_OIL | Status: DC | PRN

## 2016-08-06 MED ORDER — WITCH HAZEL-GLYCERIN EX PADS: 1.0000 "application " | MEDICATED_PAD | CUTANEOUS | Status: DC | PRN

## 2016-08-06 MED ORDER — SENNOSIDES-DOCUSATE SODIUM 8.6-50 MG PO TABS
2.0000 | ORAL_TABLET | ORAL | Status: DC
  Administered 2016-08-06: 2 via ORAL
  Filled 2016-08-06: qty 2

## 2016-08-06 MED ORDER — AMOXICILLIN-POT CLAVULANATE 875-125 MG PO TABS
1.0000 | ORAL_TABLET | Freq: Two times a day (BID) | ORAL | Status: DC
  Administered 2016-08-06 – 2016-08-07 (×2): 1 via ORAL
  Filled 2016-08-06 (×4): qty 1

## 2016-08-06 MED ORDER — ONDANSETRON HCL 4 MG/2ML IJ SOLN: 4.0000 mg | INTRAMUSCULAR | Status: DC | PRN

## 2016-08-06 MED ORDER — PRENATAL MULTIVITAMIN CH
1.0000 | ORAL_TABLET | Freq: Every day | ORAL | Status: DC
  Administered 2016-08-06 – 2016-08-07 (×2): 1 via ORAL
  Filled 2016-08-06 (×2): qty 1

## 2016-08-06 MED ORDER — TETANUS-DIPHTH-ACELL PERTUSSIS 5-2.5-18.5 LF-MCG/0.5 IM SUSP
0.5000 mL | Freq: Once | INTRAMUSCULAR | Status: AC
  Administered 2016-08-07: 0.5 mL via INTRAMUSCULAR
  Filled 2016-08-06: qty 0.5

## 2016-08-06 MED ORDER — BENZOCAINE-MENTHOL 20-0.5 % EX AERO
1.0000 "application " | INHALATION_SPRAY | CUTANEOUS | Status: DC | PRN
  Administered 2016-08-06 – 2016-08-07 (×2): 1 via TOPICAL
  Filled 2016-08-06 (×2): qty 56

## 2016-08-06 MED ORDER — DIPHENHYDRAMINE HCL 25 MG PO CAPS: 25.0000 mg | ORAL_CAPSULE | Freq: Four times a day (QID) | ORAL | Status: DC | PRN

## 2016-08-06 MED ORDER — ONDANSETRON HCL 4 MG PO TABS: 4.0000 mg | ORAL_TABLET | ORAL | Status: DC | PRN

## 2016-08-06 MED ORDER — SIMETHICONE 80 MG PO CHEW: 80.0000 mg | CHEWABLE_TABLET | ORAL | Status: DC | PRN

## 2016-08-06 MED ORDER — DIBUCAINE 1 % RE OINT: 1.0000 "application " | TOPICAL_OINTMENT | RECTAL | Status: DC | PRN

## 2016-08-06 NOTE — Progress Notes (Signed)
OB Interim Progress Note  S: Evaluated patient for progress of labor. Feels well, no pain after epidural placed.   O: BP 113/70   Pulse 70   Temp 98.2 F (36.8 C) (Oral)   Resp 18   Ht 5' 4"  (1.626 m)   Wt 81.6 kg (180 lb)   SpO2 98%   BMI 30.90 kg/m    Dilation: 6 Effacement (%): 80 Station: -2 Presentation: Vertex Exam by:: C. Leshowitz, RN  FHT: FHR: 120s HR, mod varability, pos accels, early decels, contractions q2-3 min  A/P: Progressed to 6cm dilation. Pit at 94. Epidural in place and patient is comfortable. Will continue to increase pit and continue expectant management. Anticipate SVD  Eloise Levels, MD 08/06/2016, 1:04 AM PGY-1

## 2016-08-06 NOTE — Anesthesia Postprocedure Evaluation (Signed)
Anesthesia Post Note  Patient: Beverly Walker  Procedure(s) Performed: * No procedures listed *  Patient location during evaluation: Mother Baby Anesthesia Type: Epidural Level of consciousness: awake, awake and alert, oriented and patient cooperative Pain management: pain level controlled Vital Signs Assessment: post-procedure vital signs reviewed and stable Respiratory status: spontaneous breathing, nonlabored ventilation and respiratory function stable Cardiovascular status: stable Postop Assessment: no headache, no backache, patient able to bend at knees and no signs of nausea or vomiting Anesthetic complications: no        Last Vitals:  Vitals:   08/06/16 0445 08/06/16 0545  BP: 119/72 125/81  Pulse: 76 86  Resp: 18 18  Temp: 36.4 C 36.3 C    Last Pain:  Vitals:   08/06/16 0545  TempSrc: Oral  PainSc: 4    Pain Goal:                 Debbi Strandberg L

## 2016-08-07 MED ORDER — RHO D IMMUNE GLOBULIN 1500 UNIT/2ML IJ SOSY
300.0000 ug | PREFILLED_SYRINGE | Freq: Once | INTRAMUSCULAR | Status: AC
  Administered 2016-08-07: 300 ug via INTRAVENOUS
  Filled 2016-08-07: qty 2

## 2016-08-07 MED ORDER — IBUPROFEN 600 MG PO TABS: 600.0000 mg | ORAL_TABLET | Freq: Four times a day (QID) | ORAL | 0 refills | Status: AC

## 2016-08-07 NOTE — Discharge Summary (Signed)
OB Discharge Summary     Patient Name: Beverly Walker DOB: 07-13-1976 MRN: [id number]  Date of admission: 08/05/2016 Delivering MD: Eloise Levels   Date of discharge: 08/07/2016  Admitting diagnosis: 40 WKS, LABOR Intrauterine pregnancy: [redacted]w[redacted]d    Secondary diagnosis:  Active Problems:   Labor and delivery indication for care or intervention  Additional problems: none     Discharge diagnosis: Term Pregnancy Delivered                                                                                                Post partum procedures:none  Augmentation: Pitocin  Complications: None  Hospital course:  Onset of Labor With Vaginal Delivery     41y.o. yo [id number] admitted in Latent Labor on 08/05/2016. Patient had an uncomplicated labor course as follows:  Membrane Rupture Time/Date: 9:30 AM ,08/05/2016   Intrapartum Procedures: Episiotomy: None [1]                                         Lacerations:  Labial [10];1st degree [2]  Patient had a delivery of a Viable infant. 08/06/2016  Information for the patient's newborn:  MaTaunia, Frasco0[[id number]]Delivery Method: Vag-Spont    Pateint had an uncomplicated postpartum course.  She is ambulating, tolerating a regular diet, passing flatus, and urinating well. Patient is discharged home in stable condition on 08/07/16.   Physical exam  Vitals:   08/06/16 0545 08/06/16 0950 08/06/16 1736 08/07/16 0532  BP: 125/81 127/79 120/81 112/77  Pulse: 86 66 73 73  Resp: 18 16 18 18   Temp: 97.4 F (36.3 C) 97.5 F (36.4 C) 98.5 F (36.9 C) 98 F (36.7 C)  TempSrc: Oral Oral Oral   SpO2: 98%     Weight:      Height:       General: alert, cooperative and no distress Lochia: appropriate Uterine Fundus: firm Incision: Healing well with no significant drainage DVT Evaluation: No evidence of DVT seen on physical exam. Negative Homan's sign. Labs: Lab Results  Component Value Date   WBC 15.4 (H) 08/05/2016   HGB 13.0 08/05/2016   HCT 36.4 08/05/2016   MCV 89.4 08/05/2016   PLT 373 08/05/2016   No flowsheet data found.  Discharge instruction: per After Visit Summary and "Baby and Me Booklet".  After visit meds:  Allergies as of 08/07/2016      Reactions   Nickel    metals      Medication List    STOP taking these medications   acetaminophen 325 MG tablet Commonly known as:  TYLENOL   aspirin EC 81 MG tablet   Doxylamine-Pyridoxine 10-10 MG Tbec Commonly known as:  DICLEGIS     TAKE these medications   amoxicillin-clavulanate 875-125 MG tablet Commonly known as:  AUGMENTIN Take 1 tablet by mouth 2 (two) times daily. X 7 days   CITRANATAL ASSURE 35-1 & 300 MG tablet One tablet and one capsule daily  fluticasone 50 MCG/ACT nasal spray Commonly known as:  FLONASE Place 1 spray into both nostrils as needed.   ibuprofen 600 MG tablet Commonly known as:  ADVIL,MOTRIN Take 1 tablet (600 mg total) by mouth every 6 (six) hours.   omeprazole 20 MG capsule Commonly known as:  PRILOSEC Take 1 capsule (20 mg total) by mouth daily.       Diet: routine diet  Activity: Advance as tolerated. Pelvic rest for 6 weeks.   Outpatient follow up:6 weeks Follow up Appt:No future appointments. Follow up Visit:No Follow-up on file.  Postpartum contraception: Tubal Ligation  Newborn Data: Live born female  Birth Weight: 6 lb 15.6 oz (3164 g) APGAR: 9, 9  Baby Feeding: Bottle Disposition:home with mother   08/07/2016 Truett Mainland, DO

## 2016-08-07 NOTE — Progress Notes (Signed)
CLINICAL SOCIAL WORK MATERNAL/CHILD NOTE  Patient Details  Name: Beverly Walker MRN: [id number] Date of Birth: 08/06/2015  Date:  08/07/2016  Clinical Social Worker Initiating Note:  Terri Piedra, [place] Date/ Time Initiated:  08/07/16/1300     Child's Name:  Beverly Walker   Legal Guardian:  Other (Comment) (Parents: Beverly Walker and Beverly Walker)   Need for Interpreter:  None   Date of Referral:  08/07/16     Reason for Referral:  Current Substance Use/Substance Use During Pregnancy , Other (Comment) (Anxiety/Depression)   Referral Source:  [facility]   Address:  [address] [place], [place] [address]  Phone number:  [phone]   Household Members:  Minor Children, Significant Other (Parents live together with their 97 year old daughter Beverly Walker.)   Natural Supports (not living in the home):  Parent (MOB reports that her mother is their greatest support person outside of each other.)   Professional Supports: None   Employment:     Type of Work: FOB works Architect jobs.  MOB was painting with her father, but plans to stay at home with the baby.   Education:      Museum/gallery curator Resources:  Medicaid   Other Resources:  Physicist, medical , [facility]   Cultural/Religious Considerations Which May Impact Care: None stated.  Strengths:  Ability to meet basic needs , Home prepared for child , Pediatrician chosen  ([facility])   Risk Factors/Current Problems:  Mental Health Concerns , Substance Use  (Hx marijuana use and Anx/Dep)   Cognitive State:  Able to Concentrate , Alert , Insightful , Linear Thinking , Goal Oriented    Mood/Affect:  Calm , Interested    CSW Assessment: CSW met with parents in MOB's first floor room/134 to offer support and complete assessment due to hx of marijuana use and Anxiety/Depression.  Parents were pleasant and welcoming.  MOB stated we could talk openly with FOB present. Both parents were attentive to baby while CSW spoke  with them.  Bonding is evident.  MOB seemed open to talking about her mental health hx and FOB appeared supportive and engaged.   MOB informed CSW that this is her third child, and second with FOB.  She has an 93 year old daughter named Beverly Walker, who lives with her PGM.  This is clearly a stressful topic for MOB and states that her ex-husband has made it difficult for her to see their daughter over the last 9 years (since their separation).  MOB reports that they have a good relationship, and talk daily, but do not see each other.  MOB thinks that her daughter does not want to be "caught in the middle" of her parents.  MOB states that she used to have a good relationship with her own father, but thinks that he "took his (ex-husband) side after the separation."  MOB discussed how this situation has caused her a great deal of Anxiety and Depression over the years.  She states she often struggles with Anxiety.  CSW talked with her about her symptoms, coping mechanisms and treatment options.  CSW also provided education regarding PMADs and the importance of monitoring for and reporting symptoms.  MOB was able to identify positive coping mechanisms such as counting and talking with FOB to take her mind off of her anxiety.  She notes that social situations cause her increased anxiety, but also that she feels anxious most of the time.  CSW discussed the benefits of medication and counseling and MOB  was receptive to resources and information given by CSW.  She states plans to follow up with a mental health provider to initiate services.  She seemed very appreciative of CSW's visit and concern for her emotional wellbeing.  She denies SI/HI stating that "my children mean too much to me."  MOB reports taking Celexa in the past, but not liking the side effects, which she reports as dry mouth, increase in migraines and sexual side effects.  She understands that there are numerous different medications that her doctor can  prescribe and is interested in restarting medication, since she also thinks it helped lessen her anxiety.  MOB denied need for a referral to be made and states a commitment to following up on her own.   MOB admits to smoking marijuana at times to help reduce anxiety and increase ability to sleep.  She states she stopped smoking approximately 4-5 months ago and stated understanding of the hospital drug screen policy as explained by CSW.  She denies need for substance use resources.  Baby's UDS is negative.  CSW will monitor CDS result and make report to Child Protective Services accordingly.  CSW Plan/Description:  Information/Referral to [employer] , No Further Intervention Required/No Barriers to Discharge, Patient/Family Education     Alphonzo Cruise, [place] 08/07/2016, 2:59 PM

## 2016-08-07 NOTE — Discharge Instructions (Signed)
Vaginal Delivery, Care After Refer to this sheet in the next few weeks. These instructions provide you with information about caring for yourself after vaginal delivery. Your health care provider may also give you more specific instructions. Your treatment has been planned according to current medical practices, but problems sometimes occur. Call your health care provider if you have any problems or questions. What can I expect after the procedure? After vaginal delivery, it is common to have:  Some bleeding from your vagina.  Soreness in your abdomen, your vagina, and the area of skin between your vaginal opening and your anus (perineum).  Pelvic cramps.  Fatigue. Follow these instructions at home: Medicines  Take over-the-counter and prescription medicines only as told by your health care provider.  If you were prescribed an antibiotic medicine, take it as told by your health care provider. Do not stop taking the antibiotic until it is finished. Driving  Do not drive or operate heavy machinery while taking prescription pain medicine.  Do not drive for 24 hours if you received a sedative. Lifestyle  Do not drink alcohol. This is especially important if you are breastfeeding or taking medicine to relieve pain.  Do not use tobacco products, including cigarettes, chewing tobacco, or e-cigarettes. If you need help quitting, ask your health care provider. Eating and drinking  Drink at least 8 eight-ounce glasses of water every day unless you are told not to by your health care provider. If you choose to breastfeed your baby, you may need to drink more water than this.  Eat high-fiber foods every day. These foods may help prevent or relieve constipation. High-fiber foods include:  Whole grain cereals and breads.  Brown rice.  Beans.  Fresh fruits and vegetables. Activity  Return to your normal activities as told by your health care provider. Ask your health care provider what  activities are safe for you.  Rest as much as possible. Try to rest or take a nap when your baby is sleeping.  Do not lift anything that is heavier than your baby or 10 lb (4.5 kg) until your health care provider says that it is safe.  Talk with your health care provider about when you can engage in sexual activity. This may depend on your:  Risk of infection.  Rate of healing.  Comfort and desire to engage in sexual activity. Vaginal Care  If you have an episiotomy or a vaginal tear, check the area every day for signs of infection. Check for:  More redness, swelling, or pain.  More fluid or blood.  Warmth.  Pus or a bad smell.  Do not use tampons or douches until your health care provider says this is safe.  Watch for any blood clots that may pass from your vagina. These may look like clumps of dark red, brown, or black discharge. General instructions  Keep your perineum clean and dry as told by your health care provider.  Wear loose, comfortable clothing.  Wipe from front to back when you use the toilet.  Ask your health care provider if you can shower or take a bath. If you had an episiotomy or a perineal tear during labor and delivery, your health care provider may tell you not to take baths for a certain length of time.  Wear a bra that supports your breasts and fits you well.  If possible, have someone help you with household activities and help care for your baby for at least a few days after you leave  the hospital.  Keep all follow-up visits for you and your baby as told by your health care provider. This is important. Contact a health care provider if:  You have:  Vaginal discharge that has a bad smell.  Difficulty urinating.  Pain when urinating.  A sudden increase or decrease in the frequency of your bowel movements.  More redness, swelling, or pain around your episiotomy or vaginal tear.  More fluid or blood coming from your episiotomy or vaginal  tear.  Pus or a bad smell coming from your episiotomy or vaginal tear.  A fever.  A rash.  Little or no interest in activities you used to enjoy.  Questions about caring for yourself or your baby.  Your episiotomy or vaginal tear feels warm to the touch.  Your episiotomy or vaginal tear is separating or does not appear to be healing.  Your breasts are painful, hard, or turn red.  You feel unusually sad or worried.  You feel nauseous or you vomit.  You pass large blood clots from your vagina. If you pass a blood clot from your vagina, save it to show to your health care provider. Do not flush blood clots down the toilet without having your health care provider look at them.  You urinate more than usual.  You are dizzy or light-headed.  You have not breastfed at all and you have not had a menstrual period for 12 weeks after delivery.  You have stopped breastfeeding and you have not had a menstrual period for 12 weeks after you stopped breastfeeding. Get help right away if:  You have:  Pain that does not go away or does not get better with medicine.  Chest pain.  Difficulty breathing.  Blurred vision or spots in your vision.  Thoughts about hurting yourself or your baby.  You develop pain in your abdomen or in one of your legs.  You develop a severe headache.  You faint.  You bleed from your vagina so much that you fill two sanitary pads in one hour. This information is not intended to replace advice given to you by your health care provider. Make sure you discuss any questions you have with your health care provider. Document Released: 06/29/2000 Document Revised: 12/14/2015 Document Reviewed: 07/17/2015 Elsevier Interactive Patient Education  2017 [employer].

## 2016-08-08 LAB — RH IG WORKUP (INCLUDES ABO/RH)
ABO/RH(D): O NEG
Fetal Screen: NEGATIVE
GESTATIONAL AGE(WKS): 40
UNIT DIVISION: 0

## 2016-08-09 ENCOUNTER — Encounter: Payer: Medicaid Other | Admitting: Obstetrics and Gynecology

## 2016-08-11 ENCOUNTER — Inpatient Hospital Stay (HOSPITAL_COMMUNITY): Payer: Medicaid Other

## 2016-09-20 ENCOUNTER — Ambulatory Visit (INDEPENDENT_AMBULATORY_CARE_PROVIDER_SITE_OTHER): Payer: Medicaid Other | Admitting: Women's Health

## 2016-09-20 ENCOUNTER — Encounter: Payer: Self-pay | Admitting: Women's Health

## 2016-09-20 DIAGNOSIS — Z30011 Encounter for initial prescription of contraceptive pills: Secondary | ICD-10-CM

## 2016-09-20 MED ORDER — NORETHINDRONE 0.35 MG PO TABS: ORAL_TABLET | ORAL | 11 refills | Status: DC

## 2016-09-20 NOTE — Progress Notes (Addendum)
Subjective:    Beverly Walker is a 41 y.o. [phone] Caucasian female who presents for a postpartum visit. She is 6 weeks postpartum following a VBAC at 40.2 gestational weeks. Anesthesia: epidural. I have fully reviewed the prenatal and intrapartum course. Postpartum course has been uncomplicated. Baby's course has been uncomplicated. Baby is feeding by bottle. Bleeding period started last Tues and ending now. Bowel function is normal. Bladder function is normal. Patient is not sexually active. Last sexual activity: prior to birth of baby. Contraception method is had originally wanted interval salpingectomy- discussed [facility] doesn't pay for it, discussed BTL vs. LARCs, pt wants to go back on mini-pill for now- she was on this for 54yr prior to pregnancy d/t age/smoking, wants to think some more about other options. Postpartum depression screening: equivocal. Score 10. Eating and sleeping well, denies SI/HI/II. Just anxious, which she states is normal for her, wasn't getting much sleep to begin w/ d/t baby, but that is getting better so she feels things are starting to improve.  Smoker- down to 1ppd from 2ppd. 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  Review of Systems Pertinent items are noted in HPI.   Vitals:   09/20/16 0907  BP: (!) 140/100  Pulse: 72  Weight: 172 lb 9.6 oz (78.3 kg)   No LMP recorded.  BP recheck by me 130/80  Objective:   General:  alert, cooperative and no distress   Breasts:  deferred, no complaints  Lungs: clear to auscultation bilaterally  Heart:  regular rate and rhythm  Abdomen: soft, nontender   Vulva: normal  Vagina: normal vagina  Cervix:  closed  Corpus: Well-involuted  Adnexa:  Non-palpable  Rectal Exam: No hemorrhoids      Assessment:   Postpartum exam 6 wks s/p SVB Initially elevated bp, normal on recheck Bottlefeeding Depression screening Contraception counseling  Smoker Abnormal pap  Plan:  Contraception: rx micronor w/ 11RF , understands has to take at exact same time daily to be effective, set alarm to help remind Advised smoking cessation Follow up in: 1 week for colpo w/ JVF and to discuss btl if she decides to go this route  BTawnya CrookCNM, WDecatur [facility] Center3/02/2017 9:33 AM

## 2016-10-03 ENCOUNTER — Encounter: Payer: Medicaid Other | Admitting: Obstetrics and Gynecology

## 2016-10-11 ENCOUNTER — Ambulatory Visit (INDEPENDENT_AMBULATORY_CARE_PROVIDER_SITE_OTHER): Payer: Medicaid Other | Admitting: Obstetrics and Gynecology

## 2016-10-11 ENCOUNTER — Encounter: Payer: Self-pay | Admitting: Obstetrics and Gynecology

## 2016-10-11 ENCOUNTER — Other Ambulatory Visit: Payer: Self-pay | Admitting: Obstetrics and Gynecology

## 2016-10-11 VITALS — BP 100/72 | HR 85 | Ht 65.0 in | Wt 173.0 lb

## 2016-10-11 DIAGNOSIS — R87612 Low grade squamous intraepithelial lesion on cytologic smear of cervix (LGSIL): Secondary | ICD-10-CM | POA: Diagnosis not present

## 2016-10-11 DIAGNOSIS — Z3202 Encounter for pregnancy test, result negative: Secondary | ICD-10-CM

## 2016-10-11 DIAGNOSIS — N870 Mild cervical dysplasia: Secondary | ICD-10-CM

## 2016-10-11 LAB — POCT URINE PREGNANCY: Preg Test, Ur: NEGATIVE

## 2016-10-11 NOTE — Progress Notes (Signed)
  Beverly Walker 41 y.o. 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 Pap smear on 03/28/16.  Discussed role for HPV in cervical dysplasia, need for surveillance.  Patient given informed consent, signed copy in the chart, time out was performed.  Placed in lithotomy position. Cervix viewed with speculum and colposcope after application of acetic acid.   Colposcopy adequate? Yes Small white punctation at 6 and 8 o'clock; biopsies obtained at 6 o'clock and 8 o'clock.   ECC specimen obtained. All specimens were labelled and sent to pathology.   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 Will follow up pathology in office 1 week. Return appointment in 1 year. Routine preventative health maintenance measures emphasized.    By signing my name below, I, Sonum Patel, attest that this documentation has been prepared under the direction and in the presence of Jonnie Kind, MD. Electronically Signed: Sonum Patel, Scribe. 10/11/16. 2:49 PM.  I personally performed the services described in this documentation, which was SCRIBED in my presence. The recorded information has been reviewed and considered accurate. It has been edited as necessary during review. Jonnie Kind, MD

## 2016-10-17 ENCOUNTER — Ambulatory Visit: Payer: Medicaid Other | Admitting: Obstetrics and Gynecology

## 2016-10-22 ENCOUNTER — Telehealth: Payer: Self-pay | Admitting: *Deleted

## 2016-10-22 NOTE — Telephone Encounter (Signed)
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 Patient was grateful.

## 2016-10-25 ENCOUNTER — Ambulatory Visit: Payer: Medicaid Other | Admitting: Obstetrics and Gynecology

## 2016-11-02 ENCOUNTER — Telehealth: Payer: Self-pay | Admitting: *Deleted

## 2016-11-02 MED ORDER — CITALOPRAM HYDROBROMIDE 20 MG PO TABS: 20.0000 mg | ORAL_TABLET | Freq: Every day | ORAL | 2 refills | Status: DC

## 2016-11-02 NOTE — Telephone Encounter (Signed)
Informed patient that Celexa was sent to pharmacy but needs follow-up in 4 weeks. Pt states she will call back to reschedule appointment.

## 2016-11-02 NOTE — Addendum Note (Signed)
Addended by: Roma Schanz on: 11/02/2016 02:12 PM   Modules accepted: Orders

## 2016-11-02 NOTE — Telephone Encounter (Signed)
Patient called stating she was once on Celexa 42m daily, she is requesting a refill. She and her significant other have separated and she is afraid if she doesn't start back on something, she will go into a "manic" state. Please advise.

## 2017-02-07 ENCOUNTER — Encounter (HOSPITAL_COMMUNITY): Payer: Self-pay | Admitting: Emergency Medicine

## 2017-02-07 ENCOUNTER — Emergency Department (HOSPITAL_COMMUNITY)
Admission: EM | Admit: 2017-02-07 | Discharge: 2017-02-07 | Disposition: A | Discharge status: Home / Self Care | Payer: Medicaid Other | Attending: Emergency Medicine | Admitting: Emergency Medicine

## 2017-02-07 DIAGNOSIS — I10 Essential (primary) hypertension: Secondary | ICD-10-CM | POA: Diagnosis not present

## 2017-02-07 DIAGNOSIS — G43109 Migraine with aura, not intractable, without status migrainosus: Secondary | ICD-10-CM | POA: Diagnosis not present

## 2017-02-07 DIAGNOSIS — R51 Headache: Secondary | ICD-10-CM | POA: Diagnosis present

## 2017-02-07 DIAGNOSIS — F17210 Nicotine dependence, cigarettes, uncomplicated: Secondary | ICD-10-CM | POA: Diagnosis not present

## 2017-02-07 DIAGNOSIS — Z79899 Other long term (current) drug therapy: Secondary | ICD-10-CM | POA: Insufficient documentation

## 2017-02-07 MED ORDER — KETOROLAC TROMETHAMINE 30 MG/ML IJ SOLN
30.0000 mg | Freq: Once | INTRAMUSCULAR | Status: AC
  Administered 2017-02-07: 30 mg via INTRAVENOUS
  Filled 2017-02-07: qty 1

## 2017-02-07 MED ORDER — METOCLOPRAMIDE HCL 5 MG/ML IJ SOLN
10.0000 mg | Freq: Once | INTRAMUSCULAR | Status: AC
  Administered 2017-02-07: 10 mg via INTRAMUSCULAR
  Filled 2017-02-07: qty 2

## 2017-02-07 MED ORDER — DIPHENHYDRAMINE HCL 50 MG/ML IJ SOLN
25.0000 mg | Freq: Once | INTRAMUSCULAR | Status: AC
  Administered 2017-02-07: 25 mg via INTRAVENOUS
  Filled 2017-02-07: qty 1

## 2017-02-07 NOTE — ED Provider Notes (Signed)
[facility] DEPT Provider Note   CSN: [id number] Arrival date & time: 02/07/17  3220     History   Chief Complaint Chief Complaint  Patient presents with  . Headache    HPI Beverly Walker is a 41 y.o. female.  HPI   Beverly Walker is a 41 y.o. female who presents to the Emergency Department complaining of gradual onset of throbbing, stabbing headache that began last evening.  She describes stabbing pains behind her eyes associated with nausea, vomiting, photophobia and "zig/zags of light" in her peripheral vision.  She reports hx of headaches and anxiety. She was seen by a neurologist approx 2 years ago for her headaches, states she was suppose to have additional testing, but was never called back for an appt.  She denies fever, chills, numbness, weakness, chest or abdominal pain, neck pain or stiffness.    Past Medical History:  Diagnosis Date  . Anxiety   . Headache   . Hypertension    pre-eclampsia  . Vaginal Pap smear, abnormal 2005, 2014   abnormal x2 in 2005, ASCUS 2014    Patient Active Problem List   Diagnosis Date Noted  . Abnormal Pap smear of cervix 03/31/2016  . Smoker 03/28/2016  . Marijuana use 03/28/2016  . Hx of preeclampsia 03/28/2016  . History of preterm delivery 03/28/2016  . Previous cesarean section 03/28/2016    Past Surgical History:  Procedure Laterality Date  . CESAREAN SECTION      OB History    Gravida Para Term Preterm AB Living   6 3 1 2 3 3    SAB TAB Ectopic Multiple Live Births         0 3       Home Medications    Prior to Admission medications   Medication Sig Start Date End Date Taking? Authorizing Provider  citalopram (CELEXA) 20 MG tablet Take 1 tablet (20 mg total) by mouth daily. 11/02/16   Roma Schanz, CNM  fluticasone (FLONASE) 50 MCG/ACT nasal spray Place 1 spray into both nostrils as needed.     [provider]  ibuprofen (ADVIL,MOTRIN) 600 MG tablet Take 1 tablet (600 mg total) by mouth every  6 (six) hours. 08/07/16   Truett Mainland, DO  norethindrone (MICRONOR,CAMILA,ERRIN) 0.35 MG tablet 1 tablet by mouth at same time daily 09/20/16   Roma Schanz, CNM    Family History Family History  Problem Relation Age of Onset  . Thyroid disease Mother   . Hyperlipidemia Mother   . Arthritis Mother   . Arthritis Father   . COPD Sister   . Arthritis Maternal Grandmother   . Diabetes Maternal Grandfather   . Kidney disease Maternal Grandfather   . Cancer Paternal Grandmother        breast,stomach,colon  . Diabetes Paternal Grandmother     Social History Social History  Substance Use Topics  . Smoking status: Current Every Day Smoker    Packs/day: 1.00    Years: 24.00    Types: Cigarettes  . Smokeless tobacco: Never Used  . Alcohol use No     Allergies   Nickel   Review of Systems Review of Systems  Constitutional: Negative for activity change, appetite change and fever.  HENT: Negative for facial swelling and trouble swallowing.   Eyes: Positive for photophobia and visual disturbance. Negative for pain.  Respiratory: Negative for shortness of breath.   Cardiovascular: Negative for chest pain.  Gastrointestinal: Positive for nausea and vomiting. Negative  for abdominal pain.  Genitourinary: Negative for dysuria, vaginal bleeding and vaginal discharge.  Musculoskeletal: Negative for neck pain and neck stiffness.  Skin: Negative for rash and wound.  Neurological: Positive for headaches. Negative for dizziness, facial asymmetry, speech difficulty, weakness and numbness.  Psychiatric/Behavioral: Negative for confusion and decreased concentration.  All other systems reviewed and are negative.    Physical Exam Updated Vital Signs BP 90/75   Pulse 82   Temp 98.3 F (36.8 C) (Oral)   Resp 15   Ht 5' 5"  (1.651 m)   Wt 77.1 kg (170 lb)   SpO2 98%   BMI 28.29 kg/m   Physical Exam  Constitutional: She is oriented to person, place, and time. She appears  well-developed and well-nourished. No distress.  HENT:  Head: Normocephalic and atraumatic.  Mouth/Throat: Oropharynx is clear and moist.  Eyes: Pupils are equal, round, and reactive to light. EOM are normal.  Neck: Normal range of motion and phonation normal. Neck supple. No spinous process tenderness and no muscular tenderness present. No neck rigidity. No Brudzinski's sign and no Kernig's sign noted.  Cardiovascular: Normal rate, regular rhythm, normal heart sounds and intact distal pulses.   No murmur heard. Pulmonary/Chest: Effort normal and breath sounds normal. No respiratory distress.  Musculoskeletal: Normal range of motion.  Neurological: She is alert and oriented to person, place, and time. She has normal strength. No cranial nerve deficit or sensory deficit. She exhibits normal muscle tone. Coordination and gait normal. GCS eye subscore is 4. GCS verbal subscore is 5. GCS motor subscore is 6.  Reflex Scores:      Tricep reflexes are 2+ on the right side and 2+ on the left side.      Bicep reflexes are 2+ on the right side and 2+ on the left side. CN II-XII intact.  No facial droop, grip strength symmetrical, 5/5 motor strength of LE's  Skin: Skin is warm and dry. Capillary refill takes less than 2 seconds.  Psychiatric: She has a normal mood and affect.  Nursing note and vitals reviewed.    ED Treatments / Results  Labs (all labs ordered are listed, but only abnormal results are displayed) Labs Reviewed - No data to display  EKG  EKG Interpretation None       Radiology No results found.  Procedures Procedures (including critical care time)  Medications Ordered in ED Medications  ketorolac (TORADOL) 30 MG/ML injection 30 mg (30 mg Intravenous Given 02/07/17 1004)  diphenhydrAMINE (BENADRYL) injection 25 mg (25 mg Intravenous Given 02/07/17 1004)  metoCLOPramide (REGLAN) injection 10 mg (10 mg Intramuscular Given 02/07/17 1005)     Initial Impression / Assessment  and Plan / ED Course  I have reviewed the triage vital signs and the nursing notes.  Pertinent labs & imaging results that were available during my care of the patient were reviewed by me and considered in my medical decision making (see chart for details).     Pt also seen by Dr. Lacinda Axon care plan discussed.    Pt well appearing.  vitals stable.  No focal neuro deficits, no meningeal signs.   1050  patient states she is feeling better, headache has resolved. She is sitting upright, smiling, and bottle feeding her child. States her headache pain is a "0" history of recurrent headaches. No red flags on exam today. Patient appears stable for discharge, I will give neurology referral since this is a recurrent problem.   Final Clinical Impressions(s) / ED Diagnoses  Final diagnoses:  Migraine with aura and without status migrainosus, not intractable    New Prescriptions New Prescriptions   No medications on file     Kem Parkinson, Hershal Coria 02/07/17 Garrison, Brian, MD 02/08/17 [phone]

## 2017-02-07 NOTE — ED Triage Notes (Signed)
Pt c/o ha 10/10 last night with n/v and wavy vision. Pt reports ha 4/10 this am with increased hr and anxiety. Pt is 6 months postpartum and c/o worsening anxiety/deppression.

## 2017-02-07 NOTE — Discharge Instructions (Signed)
Rest, call Dr. Freddie Apley office to arrange a follow-up appt.  Return to ER for any worsening symptoms

## 2017-09-26 ENCOUNTER — Telehealth: Payer: Self-pay | Admitting: *Deleted

## 2017-09-26 ENCOUNTER — Other Ambulatory Visit: Payer: Self-pay | Admitting: Women's Health

## 2017-09-27 NOTE — Telephone Encounter (Signed)
Informed patient she will need pap/physical before further refills.

## 2017-10-28 ENCOUNTER — Other Ambulatory Visit: Payer: Self-pay | Admitting: Obstetrics and Gynecology

## 2017-11-05 ENCOUNTER — Other Ambulatory Visit: Payer: Self-pay | Admitting: Women's Health

## 2017-11-14 ENCOUNTER — Other Ambulatory Visit: Payer: Self-pay | Admitting: Obstetrics and Gynecology

## 2017-12-12 ENCOUNTER — Other Ambulatory Visit: Payer: Self-pay | Admitting: Obstetrics and Gynecology

## 2017-12-20 ENCOUNTER — Other Ambulatory Visit: Payer: Self-pay | Admitting: Women's Health

## 2018-01-08 ENCOUNTER — Encounter (INDEPENDENT_AMBULATORY_CARE_PROVIDER_SITE_OTHER): Payer: Self-pay

## 2018-01-08 ENCOUNTER — Other Ambulatory Visit (HOSPITAL_COMMUNITY)
Admission: RE | Admit: 2018-01-08 | Discharge: 2018-01-08 | Disposition: A | Discharge status: Home / Self Care | Payer: Medicaid Other | Source: Ambulatory Visit | Attending: Obstetrics and Gynecology | Admitting: Obstetrics and Gynecology

## 2018-01-08 ENCOUNTER — Ambulatory Visit: Payer: Medicaid Other | Admitting: Obstetrics and Gynecology

## 2018-01-08 ENCOUNTER — Encounter: Payer: Self-pay | Admitting: Obstetrics and Gynecology

## 2018-01-08 VITALS — BP 93/68 | HR 94 | Ht 65.0 in | Wt 149.8 lb

## 2018-01-08 DIAGNOSIS — Z01419 Encounter for gynecological examination (general) (routine) without abnormal findings: Secondary | ICD-10-CM | POA: Insufficient documentation

## 2018-01-08 DIAGNOSIS — Z0000 Encounter for general adult medical examination without abnormal findings: Secondary | ICD-10-CM

## 2018-01-08 NOTE — Progress Notes (Signed)
  Assessment:  1. Annual Gyn Exam  2. 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 pap smear done, next pap due 1 year 2. return annually or prn 3. Annual mammogram advised after age 41 Subjective:  Beverly Walker is a 42 y.o. female [phone] who presents for annual exam. Patient's last menstrual period was 01/02/2018. The patient has no complaints today. She was last seen on 03/28/2016 for a PAP that resulted abnormal. 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 She denies fever, chills or any other symptoms or complaints at this time.   The following portions of the patient's history were reviewed and updated as appropriate: allergies, current medications, past family history, past medical history, past social history, past surgical history and problem list. Past Medical History:  Diagnosis Date  . Anxiety   . Headache   . Hypertension    pre-eclampsia  . Vaginal Pap smear, abnormal 2005, 2014   abnormal x2 in 2005, ASCUS 2014    Past Surgical History:  Procedure Laterality Date  . CESAREAN SECTION       Current Outpatient Medications:  .  fluticasone (FLONASE) 50 MCG/ACT nasal spray, Place 1 spray into both nostrils as needed. , Disp: , Rfl:  .  ibuprofen (ADVIL,MOTRIN) 600 MG tablet, Take 1 tablet (600 mg total) by mouth every 6 (six) hours., Disp: 30 tablet, Rfl: 0 .  norethindrone (MICRONOR,CAMILA,ERRIN) 0.35 MG tablet, TAKE 1 TABLET BY MOUTH ONCE DAILY (NEEDS  APPOINTMENT  FOR  REFILLS), Disp: 28 tablet, Rfl: 0  Review of Systems Constitutional: negative Gastrointestinal: negative Genitourinary: negative  Objective:  BP 93/68 (BP Location: Right Arm, Patient Position: Sitting, Cuff Size: Small)   Pulse 94   Ht 5' 5"  (1.651 m)   Wt 149 lb 12.8 oz (67.9 kg)   LMP 01/02/2018   BMI 24.93 kg/m    BMI: Body mass index is 24.93 kg/m.  General Appearance: Alert, appropriate appearance for age. No acute distress HEENT: Grossly  normal Neck / Thyroid:  Cardiovascular: RRR; normal S1, S2, no murmur Lungs: CTA bilaterally Back: No CVAT Breast Exam: No dimpling, nipple retraction or discharge. No masses or nodes., Normal to inspection, Normal breast tissue bilaterally and No masses or nodes.No dimpling, nipple retraction or discharge. Gastrointestinal: Soft, non-tender, no masses or organomegaly Pelvic Exam:   External genitalia - normal Vagina - good support  Uterus - tiny  Lymphatic Exam: Non-palpable nodes in neck, clavicular, axillary, or inguinal regions Skin: no rash or abnormalities Neurologic: Normal gait and speech, no tremor  Psychiatric: Alert and oriented, appropriate affect.  Urinalysis:Not done  Mallory Shirk. MD Pgr [phone] 2:25 PM   By signing my name below, I, [name], attest that this documentation has been prepared under the direction and in the presence of Jonnie Kind, MD. Electronically Signed: [name], Medical Scribe. 01/08/18. 2:26 PM.  I personally performed the services described in this documentation, which was SCRIBED in my presence. The recorded information has been reviewed and considered accurate. It has been edited as necessary during review. Jonnie Kind, MD

## 2018-01-10 LAB — CYTOLOGY - PAP
Diagnosis: NEGATIVE
HPV: NOT DETECTED

## 2018-01-23 ENCOUNTER — Other Ambulatory Visit: Payer: Self-pay | Admitting: Women's Health

## 2019-01-13 ENCOUNTER — Other Ambulatory Visit: Payer: Self-pay | Admitting: Women's Health

## 2019-01-14 NOTE — Telephone Encounter (Signed)
Patient is calling regarding the fax for her bc.  She stated that she really needs this.

## 2019-02-19 ENCOUNTER — Telehealth: Payer: Self-pay | Admitting: Obstetrics and Gynecology

## 2019-02-19 NOTE — Telephone Encounter (Signed)
Called patient and left message informing her that we are not allowing any visitors or children to come to visit with her at this time and we are requiring a mask to be worn during the visit. Asked if she has had any exposure to anyone suspected or confirmed of having COVID-19 or if she is experiencing any of the following: fever, cough, sob, muscle pain, severe headache, sore throat, diarrhea, loss of taste or smell or ear, nose or throat discomfort to call and reschedule.  Encouraged to do e-check-in and Hello Patient prior to arrival and may call our office on arrive to our office parking lot in order for [place] to complete registration over the phone.

## 2019-02-23 ENCOUNTER — Other Ambulatory Visit: Payer: Self-pay | Admitting: Obstetrics and Gynecology

## 2019-03-11 ENCOUNTER — Other Ambulatory Visit: Payer: Self-pay | Admitting: Obstetrics and Gynecology

## 2020-08-08 ENCOUNTER — Other Ambulatory Visit: Payer: Medicaid Other | Admitting: Women's Health

## 2020-09-01 ENCOUNTER — Other Ambulatory Visit: Payer: Medicaid Other | Admitting: Women's Health

## 2020-10-03 ENCOUNTER — Other Ambulatory Visit: Payer: Medicaid Other | Admitting: Women's Health

## 2020-10-27 ENCOUNTER — Other Ambulatory Visit: Payer: Medicaid Other | Admitting: Women's Health

## 2021-09-18 DIAGNOSIS — F419 Anxiety disorder, unspecified: Secondary | ICD-10-CM | POA: Diagnosis not present

## 2021-09-22 DIAGNOSIS — F419 Anxiety disorder, unspecified: Secondary | ICD-10-CM | POA: Diagnosis not present
